# Patient Record
Sex: Female | Born: 1940
Health system: Southern US, Community
[De-identification: ages and names within clinical notes are randomized; demographics above are authoritative.]

## PROBLEM LIST (undated history)

## (undated) DIAGNOSIS — N952 Postmenopausal atrophic vaginitis: Secondary | ICD-10-CM

## (undated) DIAGNOSIS — S62609A Fracture of unspecified phalanx of unspecified finger, initial encounter for closed fracture: Secondary | ICD-10-CM

## (undated) DIAGNOSIS — D219 Benign neoplasm of connective and other soft tissue, unspecified: Secondary | ICD-10-CM

## (undated) DIAGNOSIS — I10 Essential (primary) hypertension: Secondary | ICD-10-CM

## (undated) HISTORY — DX: Essential (primary) hypertension: I10

## (undated) HISTORY — DX: Benign neoplasm of connective and other soft tissue, unspecified: D21.9

## (undated) HISTORY — PX: OTHER SURGICAL HISTORY: SHX169

## (undated) HISTORY — DX: Postmenopausal atrophic vaginitis: N95.2

## (undated) HISTORY — DX: Fracture of unspecified phalanx of unspecified finger, initial encounter for closed fracture: S62.609A

## (undated) HISTORY — PX: ABDOMINAL HYSTERECTOMY: SHX81

---

## 1997-10-16 HISTORY — PX: TOTAL ABDOMINAL HYSTERECTOMY W/ BILATERAL SALPINGOOPHORECTOMY: SHX83

## 1998-05-26 ENCOUNTER — Inpatient Hospital Stay (HOSPITAL_COMMUNITY): Admission: RE | Admit: 1998-05-26 | Discharge: 1998-05-29 | Payer: Self-pay | Admitting: Obstetrics and Gynecology

## 1999-11-21 ENCOUNTER — Other Ambulatory Visit: Admission: RE | Admit: 1999-11-21 | Discharge: 1999-11-21 | Payer: Self-pay | Admitting: Obstetrics and Gynecology

## 2000-07-05 ENCOUNTER — Ambulatory Visit (HOSPITAL_COMMUNITY): Admission: RE | Admit: 2000-07-05 | Discharge: 2000-07-05 | Payer: Self-pay | Admitting: Gastroenterology

## 2000-07-05 ENCOUNTER — Encounter (INDEPENDENT_AMBULATORY_CARE_PROVIDER_SITE_OTHER): Payer: Self-pay | Admitting: *Deleted

## 2000-12-03 ENCOUNTER — Emergency Department (HOSPITAL_COMMUNITY): Admission: EM | Admit: 2000-12-03 | Discharge: 2000-12-03 | Payer: Self-pay | Admitting: *Deleted

## 2001-04-01 ENCOUNTER — Other Ambulatory Visit: Admission: RE | Admit: 2001-04-01 | Discharge: 2001-04-01 | Payer: Self-pay | Admitting: Obstetrics and Gynecology

## 2002-04-08 ENCOUNTER — Other Ambulatory Visit: Admission: RE | Admit: 2002-04-08 | Discharge: 2002-04-08 | Payer: Self-pay | Admitting: Obstetrics and Gynecology

## 2003-04-06 ENCOUNTER — Other Ambulatory Visit: Admission: RE | Admit: 2003-04-06 | Discharge: 2003-04-06 | Payer: Self-pay | Admitting: Obstetrics and Gynecology

## 2004-05-31 ENCOUNTER — Other Ambulatory Visit: Admission: RE | Admit: 2004-05-31 | Discharge: 2004-05-31 | Payer: Self-pay | Admitting: Obstetrics and Gynecology

## 2004-06-29 ENCOUNTER — Encounter: Admission: RE | Admit: 2004-06-29 | Discharge: 2004-06-29 | Payer: Self-pay | Admitting: Orthopaedic Surgery

## 2004-07-12 ENCOUNTER — Encounter: Admission: RE | Admit: 2004-07-12 | Discharge: 2004-07-12 | Payer: Self-pay | Admitting: Orthopaedic Surgery

## 2005-07-13 ENCOUNTER — Other Ambulatory Visit: Admission: RE | Admit: 2005-07-13 | Discharge: 2005-07-13 | Payer: Self-pay | Admitting: Obstetrics and Gynecology

## 2005-12-20 ENCOUNTER — Encounter: Admission: RE | Admit: 2005-12-20 | Discharge: 2005-12-20 | Payer: Self-pay | Admitting: Orthopaedic Surgery

## 2006-02-09 ENCOUNTER — Encounter: Admission: RE | Admit: 2006-02-09 | Discharge: 2006-02-09 | Payer: Self-pay | Admitting: Orthopaedic Surgery

## 2006-12-25 ENCOUNTER — Other Ambulatory Visit: Admission: RE | Admit: 2006-12-25 | Discharge: 2006-12-25 | Payer: Self-pay | Admitting: Obstetrics and Gynecology

## 2008-01-21 ENCOUNTER — Other Ambulatory Visit: Admission: RE | Admit: 2008-01-21 | Discharge: 2008-01-21 | Payer: Self-pay | Admitting: Obstetrics and Gynecology

## 2008-11-06 ENCOUNTER — Encounter: Admission: RE | Admit: 2008-11-06 | Discharge: 2008-11-06 | Payer: Self-pay | Admitting: Gastroenterology

## 2008-12-12 ENCOUNTER — Emergency Department (HOSPITAL_COMMUNITY): Admission: EM | Admit: 2008-12-12 | Discharge: 2008-12-12 | Payer: Self-pay | Admitting: Emergency Medicine

## 2009-03-03 ENCOUNTER — Ambulatory Visit: Payer: Self-pay | Admitting: Obstetrics and Gynecology

## 2009-03-03 ENCOUNTER — Encounter: Payer: Self-pay | Admitting: Obstetrics and Gynecology

## 2009-03-03 ENCOUNTER — Other Ambulatory Visit: Admission: RE | Admit: 2009-03-03 | Discharge: 2009-03-03 | Payer: Self-pay | Admitting: Obstetrics and Gynecology

## 2010-03-09 ENCOUNTER — Ambulatory Visit: Payer: Self-pay | Admitting: Obstetrics and Gynecology

## 2010-03-09 ENCOUNTER — Other Ambulatory Visit: Admission: RE | Admit: 2010-03-09 | Discharge: 2010-03-09 | Payer: Self-pay | Admitting: Obstetrics and Gynecology

## 2010-05-10 ENCOUNTER — Ambulatory Visit: Payer: Self-pay | Admitting: Obstetrics and Gynecology

## 2010-05-23 ENCOUNTER — Ambulatory Visit: Payer: Self-pay | Admitting: Obstetrics and Gynecology

## 2010-11-05 ENCOUNTER — Encounter: Payer: Self-pay | Admitting: Orthopaedic Surgery

## 2010-11-06 ENCOUNTER — Encounter: Payer: Self-pay | Admitting: Orthopaedic Surgery

## 2011-01-30 ENCOUNTER — Other Ambulatory Visit: Payer: Self-pay | Admitting: Dermatology

## 2011-01-31 LAB — CBC
MCHC: 35.7 g/dL (ref 30.0–36.0)
Platelets: 253 10*3/uL (ref 150–400)
RBC: 4.21 MIL/uL (ref 3.87–5.11)
RDW: 12.2 % (ref 11.5–15.5)
WBC: 4.1 10*3/uL (ref 4.0–10.5)

## 2011-01-31 LAB — POCT I-STAT, CHEM 8
BUN: 27 mg/dL — ABNORMAL HIGH (ref 6–23)
Calcium, Ion: 1.12 mmol/L (ref 1.12–1.32)
Glucose, Bld: 108 mg/dL — ABNORMAL HIGH (ref 70–99)
HCT: 43 % (ref 36.0–46.0)
Hemoglobin: 14.6 g/dL (ref 12.0–15.0)

## 2011-01-31 LAB — D-DIMER, QUANTITATIVE: D-Dimer, Quant: <0.22 ug/mL-FEU (ref 0.00–0.48)

## 2011-01-31 LAB — POCT CARDIAC MARKERS
CKMB, poc: <1 ng/mL — ABNORMAL LOW (ref 1.0–8.0)
Myoglobin, poc: 46.3 ng/mL (ref 12–200)
Myoglobin, poc: 70.3 ng/mL (ref 12–200)

## 2011-01-31 LAB — DIFFERENTIAL
Basophils Absolute: 0 10*3/uL (ref 0.0–0.1)
Eosinophils Relative: 1 % (ref 0–5)
Monocytes Absolute: 0.3 10*3/uL (ref 0.1–1.0)
Monocytes Relative: 8 % (ref 3–12)
Neutro Abs: 2.1 10*3/uL (ref 1.7–7.7)

## 2011-02-28 NOTE — Consult Note (Signed)
NAME:  Chelsey Huff, Chelsey Huff                   ACCOUNT NO.:  [id number]   MEDICAL RECORD NO.:  [id number]          PATIENT TYPE:  EMS   LOCATION:  [facility]                         FACILITY:  [facility]   PHYSICIAN:  Corinna L. Lendell Caprice, MDDATE OF BIRTH:  [birth year]   DATE OF CONSULTATION:  12/12/2008  DATE OF DISCHARGE:                                 CONSULTATION   REQUESTING PHYSICIAN:  Marisa Severin, MD   REASON FOR CONSULTATION:  Chest pain.   IMPRESSION/RECOMMENDATIONS:  1. Musculoskeletal chest pain:  The patient's pain is entirely      reproducible.  She has a history of recurrent costochondritis.  I      will give her a dose of Toradol and she may follow up with her      primary care physician as needed.  I have also recommended that she      contact Dr. Katrinka Blazing as an outpatient stress test, non-urgently, may      be reasonable.  I have also recommended that she take ibuprofen 400      mg t.i.d. for 3-5 days.  2. Osteopenia.  3. Anxiety state, not otherwise specified.   HISTORY OF PRESENT ILLNESS:  Chelsey Huff is a pleasant 70 year old white  female who presents with sharp substernal chest pain.  It lasted less  than a minute and has occurred several times today.  She does not know  whether it is positional.  It was not pleuritic.  She has had no cough,  fevers, or chills.  No shortness of breath.  No nausea.   PAST MEDICAL HISTORY:  As above.   MEDICATIONS:  Calcium, Boniva, lorazepam as needed, and multivitamins.   ALLERGIES:  She is allergic to CODEINE.   SOCIAL HISTORY:  She is married.  She drinks a glass of wine or two  daily, but denies drinking too excess.  There is no history of drug  abuse.  She has experienced a lot of family stressors recently.   FAMILY HISTORY:  Reviewed and as per previous review of systems as  above, otherwise negative.   PHYSICAL EXAMINATION:  VITAL SIGNS:  Temperature is 97.8, blood pressure  158/93, pulse 84, respiratory rate 18, and oxygen saturation 100%  on  room air.  GENERAL:  The patient is an anxious-appearing white female, sometimes  tearful.  HEENT:  Normocephalic and atraumatic.  Pupils equal, round, and reactive  to light.  Sclera nonicteric.  Moist mucous membranes.  NECK:  Supple.  No carotid bruits.  No lymphadenopathy.  LUNGS:  Clear to auscultation bilaterally without wheezes, rhonchi, or  rales.  CARDIOVASCULAR:  Regular rate and rhythm without murmurs, gallops, or  rubs.  She does have reproducible chest pain to palpation of the sternal  and parasternal areas.  ABDOMEN:  Soft, nontender, and nondistended.  GU and RECTAL:  Deferred.  EXTREMITIES.  No clubbing, cyanosis, or edema.  No calf tenderness.   LABORATORY DATA:  Point-of-care enzymes negative.  CBC unremarkable.  Basic metabolic panel unremarkable.  EKG shows normal sinus rhythm with  PVCs/trigeminy.  Telemetry, the patient is currently  in normal sinus  rhythm.  Chest x-ray negative.   I will leave the message with Dr. Michaelle Copas staff.  The patient is being  discharged in stable condition.   ACTIVITY:  Ad lib.   DIET:  As tolerated.      Corinna L. Lendell Caprice, MD  Electronically Signed     CLS/MEDQ  D:  12/12/2008  T:  12/12/2008  Job:  [id number]   cc:   Lyn Records, M.D.  Tasia Catchings, M.D.

## 2011-03-03 NOTE — H&P (Signed)
[facility]  Patient:    Chelsey Huff, Chelsey Huff                  MRN: [id number] Adm. Date:  [id number] Attending:  Carmelina Peal CC:         Daphene Jaeger, M.D.                         History and Physical  EMERGENCY ROOM NOTE  HISTORY OF PRESENT ILLNESS:  Chelsey Huff is a very nice 70 year old white female who came to the emergency room with atypical chest pain.  She developed this beginning on Friday with several episodes of five to ten minutes of very sharp pain in the upper anterior chest along the sternal border on the left.  The pain was not exertional.  It was not associated with nausea, shortness of breath, diaphoresis or any other symptoms.  She never pressed on the area to see if it was tender.  It should be noted that she did have an episode of pericarditis pretty well documented in 1988, with pain at that time radiating to her left scapula and it was associated with a pericardial friction rub.  She was treated at that time with Prednisone and promptly improved.  Her workup then included a tuberculosis test, ANA and sedimentation rate all of which were normal.  She has not had any recurrence of that specific pain.  However, the current pain is somewhat similar to the type of pain she has had in the past that may have occurred as the result of a water skiing accident several years ago.  She has had a 2D echocardiogram, which is normal and no evidence of mitral valve prolapse and a negative treadmill test in the past.  Other medical problems include anxiety attacks, left ovarian and uterine fibroids, a sister with colon cancer with a negative colonoscopy and DJD.  ALLERGIES:  CODEINE.  SMOKING:  None.  ALCOHOL:  Two glasses of wine weekly.  CAFFEINE:  None.  CURRENT MEDICINES:  Enteric coated aspirin daily and Premarin 0.625 mg daily.  PREVIOUS SURGERY: Tubal ligation, dilatation and curettage and total  abdominal hysterectomy/bilateral salpingo-oophorectomy.  INJURIES:  Laceration of the right leg and fracture of the right foot.  FAMILY HISTORY:  Father died at age 74 in an automobile accident.  Mother is alive at age 24 living in [place], [place] with a pacemaker and a TIA. They have had a stormy relationship.  She has one brother who died at age 56 in an auto accident and three sisters, one with hypertension, one with colon cancer and one in good health.  She has three children, who are all alive and well.  SOCIAL HISTORY:  Native of [place].  She has lived in [place] since 1979. Married since 1969.  Magazine features editor.  PHYSICAL EXAMINATION:  VITAL SIGNS:  Blood pressure 140/90, pulse 72 and regular.  LUNGS:  Clear.  HEART:  Tones are absolutely normal and there is no friction rub.  I can reproduce her pain by pressure in the sternal chondral junction along the left sternal border.  ABDOMEN:  Obese but nontender without organomegaly or masses palpable.  EXTREMITIES:  Showed no clubbing or edema.  EKG is sinus rhythm, within normal limits.  IMPRESSION:  This appears to be chest wall pain, probably costochondritis.  PLAN:  The patient is reassured, given a prescription for Darvocet and heat. She is to call  tomorrow and I am going to go ahead and arrange an exercise test as an outpatient. DD:  12/03/00 TD:  12/04/00 Job: [id number]

## 2011-04-05 ENCOUNTER — Encounter: Payer: Self-pay | Admitting: Obstetrics and Gynecology

## 2011-04-13 ENCOUNTER — Other Ambulatory Visit: Payer: Self-pay | Admitting: Obstetrics and Gynecology

## 2011-04-13 ENCOUNTER — Other Ambulatory Visit (HOSPITAL_COMMUNITY)
Admission: RE | Admit: 2011-04-13 | Discharge: 2011-04-13 | Disposition: A | Discharge status: Home / Self Care | Payer: BC Managed Care – PPO | Source: Ambulatory Visit | Attending: Obstetrics and Gynecology | Admitting: Obstetrics and Gynecology

## 2011-04-13 ENCOUNTER — Encounter (INDEPENDENT_AMBULATORY_CARE_PROVIDER_SITE_OTHER): Payer: BC Managed Care – PPO | Admitting: Obstetrics and Gynecology

## 2011-04-13 DIAGNOSIS — Z01419 Encounter for gynecological examination (general) (routine) without abnormal findings: Secondary | ICD-10-CM

## 2011-04-13 DIAGNOSIS — Z124 Encounter for screening for malignant neoplasm of cervix: Secondary | ICD-10-CM | POA: Insufficient documentation

## 2011-04-26 ENCOUNTER — Encounter: Payer: Self-pay | Admitting: Obstetrics and Gynecology

## 2011-08-08 ENCOUNTER — Ambulatory Visit (INDEPENDENT_AMBULATORY_CARE_PROVIDER_SITE_OTHER): Payer: BC Managed Care – PPO | Admitting: Obstetrics and Gynecology

## 2011-08-08 DIAGNOSIS — E559 Vitamin D deficiency, unspecified: Secondary | ICD-10-CM

## 2011-08-09 LAB — VITAMIN D 25 HYDROXY (VIT D DEFICIENCY, FRACTURES): Vit D, 25-Hydroxy: 45 ng/mL (ref 30–89)

## 2012-03-29 ENCOUNTER — Other Ambulatory Visit: Payer: Self-pay | Admitting: *Deleted

## 2012-03-29 DIAGNOSIS — M8580 Other specified disorders of bone density and structure, unspecified site: Secondary | ICD-10-CM

## 2012-04-04 ENCOUNTER — Other Ambulatory Visit: Payer: Self-pay | Admitting: Obstetrics and Gynecology

## 2012-04-04 DIAGNOSIS — M8580 Other specified disorders of bone density and structure, unspecified site: Secondary | ICD-10-CM

## 2012-04-09 ENCOUNTER — Encounter: Payer: Self-pay | Admitting: Obstetrics and Gynecology

## 2012-04-24 ENCOUNTER — Encounter: Payer: Self-pay | Admitting: Obstetrics and Gynecology

## 2012-04-24 ENCOUNTER — Ambulatory Visit (INDEPENDENT_AMBULATORY_CARE_PROVIDER_SITE_OTHER): Payer: BC Managed Care – PPO | Admitting: Obstetrics and Gynecology

## 2012-04-24 VITALS — BP 130/78 | Ht 64.0 in | Wt 164.0 lb

## 2012-04-24 DIAGNOSIS — N952 Postmenopausal atrophic vaginitis: Secondary | ICD-10-CM | POA: Insufficient documentation

## 2012-04-24 DIAGNOSIS — D219 Benign neoplasm of connective and other soft tissue, unspecified: Secondary | ICD-10-CM | POA: Insufficient documentation

## 2012-04-24 DIAGNOSIS — Z01419 Encounter for gynecological examination (general) (routine) without abnormal findings: Secondary | ICD-10-CM

## 2012-04-24 DIAGNOSIS — M810 Age-related osteoporosis without current pathological fracture: Secondary | ICD-10-CM | POA: Insufficient documentation

## 2012-04-24 DIAGNOSIS — I10 Essential (primary) hypertension: Secondary | ICD-10-CM | POA: Insufficient documentation

## 2012-04-24 MED ORDER — ESTRADIOL 0.1 MG/GM VA CREA: 1.0000 g | TOPICAL_CREAM | Freq: Every day | VAGINAL | Status: DC

## 2012-04-24 NOTE — Progress Notes (Signed)
The patient came to see me today for her annual GYN exam. She had been diagnosed by me with osteoporosis and had taken 4 years of Boniva after previously taken Evista. The patient has now been on drug holiday for 2 years. Her bone density has gotten worse. She has lost over 5% bone in her left hip. She is not osteoporotic but has a T score of -2.3. Her fracture risk scores are elevated although obviously they are not used for treatment since she already  was treated previously. She takes calcium and extra vitamin D. Both calcium and vitamin D levels were checked last month by her PCP. She has not had any fractures. She is a nonsmoker nondrinker. She exercises regularly. 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 Her mother and grandmother had osteoporosis. She uses estrogen cream for vaginal dryness with excellent results. She is doing well without systemic HRT. She is having no vaginal bleeding. She is having no pelvic pain. She has had a hysterectomy and always had normal Pap smears. Her last Pap smear was 2012.she just had a normal mammogram.  HEENT: Within normal limits. Kennon Portela present. Neck: No masses. Supraclavicular lymph nodes: Not enlarged. Breasts: Examined in both sitting and lying position. Symmetrical without skin changes or masses. Abdomen: Soft no masses guarding or rebound. No hernias. Pelvic: External within normal limits. BUS within normal limits. Vaginal examination shows good estrogen effect, no cystocele enterocele or rectocele. Cervix and uterus absent. Adnexa within normal limits. Rectovaginal confirmatory. Extremities within normal limits.  Assessment: #1. Osteoporosis #2. Atrophic vaginitis  Plan: We had  a long discussion about reinitiating treatment based on worsening T. Scores, family history, etc. I told her I favor Prolia. She was given information to read. We will get her approved. She will continue yearly mammograms. Lab through PCP-she will  get as the results. She is very positive about taking medication.The new Pap smear guidelines were discussed with the patient. No pap done.

## 2012-05-15 ENCOUNTER — Telehealth: Payer: Self-pay | Admitting: *Deleted

## 2012-05-15 NOTE — Telephone Encounter (Signed)
Patient informed Prolia covered with no copay after doing prior auth.  Will order Prolia and call patient to set up ov for injection when it comes in.

## 2012-05-16 ENCOUNTER — Other Ambulatory Visit: Payer: Self-pay | Admitting: *Deleted

## 2012-05-16 DIAGNOSIS — M810 Age-related osteoporosis without current pathological fracture: Secondary | ICD-10-CM

## 2012-05-16 MED ORDER — DENOSUMAB 60 MG/ML ~~LOC~~ SOLN: 60.0000 mg | Freq: Once | SUBCUTANEOUS | Status: AC

## 2012-05-16 NOTE — Telephone Encounter (Signed)
Patient informed.  appt set up for injection

## 2012-05-16 NOTE — Telephone Encounter (Signed)
Patient informed Prolia injection is here in the office.  She wanted to check with you if you needed to speak with her further about anything to do with bone density, if not she will proceed with injection.

## 2012-05-16 NOTE — Telephone Encounter (Signed)
She should proceed with injection

## 2012-05-17 ENCOUNTER — Ambulatory Visit: Payer: BC Managed Care – PPO

## 2012-07-12 ENCOUNTER — Other Ambulatory Visit: Payer: Self-pay | Admitting: Internal Medicine

## 2012-07-12 DIAGNOSIS — R748 Abnormal levels of other serum enzymes: Secondary | ICD-10-CM

## 2012-07-17 ENCOUNTER — Ambulatory Visit
Admission: RE | Admit: 2012-07-17 | Discharge: 2012-07-17 | Disposition: A | Discharge status: Home / Self Care | Payer: BC Managed Care – PPO | Source: Ambulatory Visit | Attending: Internal Medicine | Admitting: Internal Medicine

## 2012-07-17 DIAGNOSIS — R748 Abnormal levels of other serum enzymes: Secondary | ICD-10-CM

## 2012-07-17 MED ORDER — IOHEXOL 300 MG/ML  SOLN
100.0000 mL | Freq: Once | INTRAMUSCULAR | Status: AC | PRN
  Administered 2012-07-17: 100 mL via INTRAVENOUS

## 2013-04-22 ENCOUNTER — Other Ambulatory Visit: Payer: Self-pay | Admitting: Radiology

## 2013-10-26 ENCOUNTER — Emergency Department (HOSPITAL_COMMUNITY): Payer: BC Managed Care – PPO

## 2013-10-26 ENCOUNTER — Emergency Department (HOSPITAL_COMMUNITY)
Admission: EM | Admit: 2013-10-26 | Discharge: 2013-10-26 | Disposition: A | Discharge status: Home / Self Care | Payer: BC Managed Care – PPO | Attending: Emergency Medicine | Admitting: Emergency Medicine

## 2013-10-26 ENCOUNTER — Encounter (HOSPITAL_COMMUNITY): Payer: Self-pay | Admitting: Emergency Medicine

## 2013-10-26 DIAGNOSIS — R071 Chest pain on breathing: Secondary | ICD-10-CM | POA: Insufficient documentation

## 2013-10-26 DIAGNOSIS — R079 Chest pain, unspecified: Secondary | ICD-10-CM

## 2013-10-26 DIAGNOSIS — Z8742 Personal history of other diseases of the female genital tract: Secondary | ICD-10-CM | POA: Insufficient documentation

## 2013-10-26 DIAGNOSIS — I10 Essential (primary) hypertension: Secondary | ICD-10-CM | POA: Insufficient documentation

## 2013-10-26 DIAGNOSIS — M810 Age-related osteoporosis without current pathological fracture: Secondary | ICD-10-CM | POA: Insufficient documentation

## 2013-10-26 DIAGNOSIS — Z79899 Other long term (current) drug therapy: Secondary | ICD-10-CM | POA: Insufficient documentation

## 2013-10-26 LAB — CBC WITH DIFFERENTIAL/PLATELET
Basophils Absolute: 0 10*3/uL (ref 0.0–0.1)
Basophils Relative: 1 % (ref 0–1)
EOS ABS: 0.1 10*3/uL (ref 0.0–0.7)
Eosinophils Relative: 2 % (ref 0–5)
HEMATOCRIT: 38.1 % (ref 36.0–46.0)
Hemoglobin: 13 g/dL (ref 12.0–15.0)
LYMPHS ABS: 1.9 10*3/uL (ref 0.7–4.0)
LYMPHS PCT: 32 % (ref 12–46)
MCH: 33.2 pg (ref 26.0–34.0)
MCHC: 34.1 g/dL (ref 30.0–36.0)
MCV: 97.4 fL (ref 78.0–100.0)
MONO ABS: 0.5 10*3/uL (ref 0.1–1.0)
MONOS PCT: 8 % (ref 3–12)
Neutro Abs: 3.5 10*3/uL (ref 1.7–7.7)
Neutrophils Relative %: 58 % (ref 43–77)
Platelets: 207 10*3/uL (ref 150–400)
RBC: 3.91 MIL/uL (ref 3.87–5.11)
RDW: 12.4 % (ref 11.5–15.5)
WBC: 6 10*3/uL (ref 4.0–10.5)

## 2013-10-26 LAB — POCT I-STAT TROPONIN I
Troponin i, poc: 0.01 ng/mL (ref 0.00–0.08)
Troponin i, poc: 0 ng/mL (ref 0.00–0.08)

## 2013-10-26 LAB — D-DIMER, QUANTITATIVE: D-Dimer, Quant: 0.28 ug/mL-FEU (ref 0.00–0.48)

## 2013-10-26 LAB — BASIC METABOLIC PANEL
BUN: 32 mg/dL — AB (ref 6–23)
CALCIUM: 9.5 mg/dL (ref 8.4–10.5)
CO2: 25 meq/L (ref 19–32)
CREATININE: 0.78 mg/dL (ref 0.50–1.10)
Chloride: 98 mEq/L (ref 96–112)
GFR calc Af Amer: >90 mL/min (ref >90)
GFR calc non Af Amer: 83 mL/min — ABNORMAL LOW (ref >90)
GLUCOSE: 105 mg/dL — AB (ref 70–99)
Potassium: 3.9 mEq/L (ref 3.7–5.3)
Sodium: 138 mEq/L (ref 137–147)

## 2013-10-26 NOTE — ED Notes (Signed)
Onset today chest pain 2/10 across chest and called EMS. Patient took own aspirin 325mg  PO. Pain currently 0/10.

## 2013-10-26 NOTE — Discharge Instructions (Signed)
Chest Pain (Nonspecific) °It is often hard to give a specific diagnosis for the cause of chest pain. There is always a chance that your pain could be related to something serious, such as a heart attack or a blood clot in the lungs. You need to follow up with your caregiver for further evaluation. °CAUSES  °· Heartburn. °· Pneumonia or bronchitis. °· Anxiety or stress. °· Inflammation around your heart (pericarditis) or lung (pleuritis or pleurisy). °· A blood clot in the lung. °· A collapsed lung (pneumothorax). It can develop suddenly on its own (spontaneous pneumothorax) or from injury (trauma) to the chest. °· Shingles infection (herpes zoster virus). °The chest wall is composed of bones, muscles, and cartilage. Any of these can be the source of the pain. °· The bones can be bruised by injury. °· The muscles or cartilage can be strained by coughing or overwork. °· The cartilage can be affected by inflammation and become sore (costochondritis). °DIAGNOSIS  °Lab tests or other studies, such as X-rays, electrocardiography, stress testing, or cardiac imaging, may be needed to find the cause of your pain.  °TREATMENT  °· Treatment depends on what may be causing your chest pain. Treatment may include: °· Acid blockers for heartburn. °· Anti-inflammatory medicine. °· Pain medicine for inflammatory conditions. °· Antibiotics if an infection is present. °· You may be advised to change lifestyle habits. This includes stopping smoking and avoiding alcohol, caffeine, and chocolate. °· You may be advised to keep your head raised (elevated) when sleeping. This reduces the chance of acid going backward from your stomach into your esophagus. °· Most of the time, nonspecific chest pain will improve within 2 to 3 days with rest and mild pain medicine. °HOME CARE INSTRUCTIONS  °· If antibiotics were prescribed, take your antibiotics as directed. Finish them even if you start to feel better. °· For the next few days, avoid physical  activities that bring on chest pain. Continue physical activities as directed. °· Do not smoke. °· Avoid drinking alcohol. °· Only take over-the-counter or prescription medicine for pain, discomfort, or fever as directed by your caregiver. °· Follow your caregiver's suggestions for further testing if your chest pain does not go away. °· Keep any follow-up appointments you made. If you do not go to an appointment, you could develop lasting (chronic) problems with pain. If there is any problem keeping an appointment, you must call to reschedule. °SEEK MEDICAL CARE IF:  °· You think you are having problems from the medicine you are taking. Read your medicine instructions carefully. °· Your chest pain does not go away, even after treatment. °· You develop a rash with blisters on your chest. °SEEK IMMEDIATE MEDICAL CARE IF:  °· You have increased chest pain or pain that spreads to your arm, neck, jaw, back, or abdomen. °· You develop shortness of breath, an increasing cough, or you are coughing up blood. °· You have severe back or abdominal pain, feel nauseous, or vomit. °· You develop severe weakness, fainting, or chills. °· You have a fever. °THIS IS AN EMERGENCY. Do not wait to see if the pain will go away. Get medical help at once. Call your local emergency services (911 in [place] Do not drive yourself to the hospital. °MAKE SURE YOU:  °· Understand these instructions. °· Will watch your condition. °· Will get help right away if you are not doing well or get worse. °Document Released: 07/12/2005 Document Revised: 12/25/2011 Document Reviewed: 05/07/2008 °ExitCare® Patient Information ©2014 [employer]   LL[employer]. ° °

## 2013-10-26 NOTE — ED Notes (Signed)
EKG completed and given to EDP at [facility]

## 2013-10-26 NOTE — ED Provider Notes (Signed)
Medical screening examination/treatment/procedure(s) were conducted as a shared visit with non-physician practitioner(s) and myself.  I personally evaluated the patient during the encounter.  EKG Interpretation    Date/Time:  Sunday October 26 2013 18:20:30 EST Ventricular Rate:  82 PR Interval:  188 QRS Duration: 99 QT Interval:  378 QTC Calculation: 441 R Axis:   -31 Text Interpretation:  Sinus rhythm Low voltage, precordial leads Left ventricular hypertrophy Confirmed by Zenia Resides  MD, Arthur Aydelotte ([id number]) on 10/26/2013 6:23:56 PM           Patient seen and examined. Had a four-minute episode of chest pain characterized as sharp and nonradiating. No associated symptoms. She is currently pain-free at this time. No recent travel history or leg pain. Patient will have a doctor troponin here and her EKG is nonacute.  If Her troponins are negative and d-dimer is negative she will be sent to cardiology followup  Leota Jacobsen, MD 10/26/13 2117

## 2013-10-26 NOTE — ED Provider Notes (Signed)
CSN: [id number]     Arrival date & time 10/26/13  1807 History   First MD Initiated Contact with Patient 10/26/13 1818     Chief Complaint  Patient presents with  . Chest Pain   (Consider location/radiation/quality/duration/timing/severity/associated sxs/prior Treatment) HPI Comments: Patient presents emergency department with chief complaint of chest pain. She states the chest pain started this evening. She states it came on suddenly, and she describes it as a sharp pressure to the side of her chest. It did not radiate. She denies any shortness of breath or diaphoresis. She states the pain has since resolved. She has a history of costochondritis, but states that this felt much different. Cardiac risk factors include hypertension and hyperlipidemia. She states that she took an aspirin at home. Currently, she states the pain is 0/10. She has never had anything like this before.  The history is provided by the patient. No language interpreter was used.    Past Medical History  Diagnosis Date  . Atrophic vaginitis   . Fibroids   . Osteoporosis   . Hypertension    Past Surgical History  Procedure Laterality Date  . Total abdominal hysterectomy w/ bilateral salpingoophorectomy  1999  . Abdominal hysterectomy     Family History  Problem Relation Age of Onset  . Colon cancer Sister   . Melanoma Son    History  Substance Use Topics  . Smoking status: Never Smoker   . Smokeless tobacco: Not on file  . Alcohol Use: 3.5 oz/week    7 drink(s) per week   OB History   Grav Para Term Preterm Abortions TAB SAB Ect Mult Living   3 3 3       3      Review of Systems  All other systems reviewed and are negative.    Allergies  Codeine and Doxycycline  Home Medications   Current Outpatient Rx  Name  Route  Sig  Dispense  Refill  . Cholecalciferol (VITAMIN D PO)   Oral   Take 3,200 Units by mouth daily.          . multivitamin (THERAGRAN) per tablet   Oral   Take 1 tablet by  mouth daily.            BP 159/60  Pulse 78  Temp(Src) 98.2 F (36.8 C) (Oral)  Resp 16  Ht 5\' 4"  (1.626 m)  Wt 162 lb (73.483 kg)  BMI 27.79 kg/m2  SpO2 100% Physical Exam  Nursing note and vitals reviewed. Constitutional: She is oriented to person, place, and time. She appears well-developed and well-nourished.  HENT:  Head: Normocephalic and atraumatic.  Eyes: Conjunctivae and EOM are normal. Pupils are equal, round, and reactive to light.  Neck: Normal range of motion. Neck supple.  Cardiovascular: Normal rate and regular rhythm.  Exam reveals no gallop and no friction rub.   No murmur heard. Pulmonary/Chest: Effort normal and breath sounds normal. No respiratory distress. She has no wheezes. She has no rales. She exhibits no tenderness.  Mild chest wall tenderness to palpation, this is normal for the patient  Abdominal: Soft. Bowel sounds are normal. She exhibits no distension and no mass. There is no tenderness. There is no rebound and no guarding.  Musculoskeletal: Normal range of motion. She exhibits no edema and no tenderness.  Neurological: She is alert and oriented to person, place, and time.  Skin: Skin is warm and dry.  Psychiatric: She has a normal mood and affect. Her behavior is  normal. Judgment and thought content normal.    ED Course  Procedures (including critical care time) Results for orders placed during the hospital encounter of 10/26/13  CBC WITH DIFFERENTIAL      Result Value Range   WBC 6.0  4.0 - 10.5 K/uL   RBC 3.91  3.87 - 5.11 MIL/uL   Hemoglobin 13.0  12.0 - 15.0 g/dL   HCT 38.1  36.0 - 46.0 %   MCV 97.4  78.0 - 100.0 fL   MCH 33.2  26.0 - 34.0 pg   MCHC 34.1  30.0 - 36.0 g/dL   RDW 12.4  11.5 - 15.5 %   Platelets 207  150 - 400 K/uL   Neutrophils Relative % 58  43 - 77 %   Neutro Abs 3.5  1.7 - 7.7 K/uL   Lymphocytes Relative 32  12 - 46 %   Lymphs Abs 1.9  0.7 - 4.0 K/uL   Monocytes Relative 8  3 - 12 %   Monocytes Absolute 0.5  0.1  - 1.0 K/uL   Eosinophils Relative 2  0 - 5 %   Eosinophils Absolute 0.1  0.0 - 0.7 K/uL   Basophils Relative 1  0 - 1 %   Basophils Absolute 0.0  0.0 - 0.1 K/uL  BASIC METABOLIC PANEL      Result Value Range   Sodium 138  137 - 147 mEq/L   Potassium 3.9  3.7 - 5.3 mEq/L   Chloride 98  96 - 112 mEq/L   CO2 25  19 - 32 mEq/L   Glucose, Bld 105 (*) 70 - 99 mg/dL   BUN 32 (*) 6 - 23 mg/dL   Creatinine, Ser 0.78  0.50 - 1.10 mg/dL   Calcium 9.5  8.4 - 10.5 mg/dL   GFR calc non Af Amer 83 (*) >90 mL/min   GFR calc Af Amer >90  >90 mL/min  D-DIMER, QUANTITATIVE      Result Value Range   D-Dimer, Quant 0.28  0.00 - 0.48 ug/mL-FEU  POCT I-STAT TROPONIN I      Result Value Range   Troponin i, poc 0.01  0.00 - 0.08 ng/mL   Comment 3           POCT I-STAT TROPONIN I      Result Value Range   Troponin i, poc 0.00  0.00 - 0.08 ng/mL   Comment 3            Dg Chest Portable 1 View  10/26/2013   CLINICAL DATA:  Chest pain.  EXAM: PORTABLE CHEST - 1 VIEW  COMPARISON:  12/12/2008.  FINDINGS: The heart is borderline enlarged. The mediastinal and hilar contours are within normal limits and unchanged. The lungs are clear. No pleural effusion. The bony thorax is intact.  IMPRESSION: No acute cardiopulmonary findings.   Electronically Signed   By: Kalman Jewels M.D.   On: 10/26/2013 20:15      EKG Interpretation    Date/Time:  Sunday October 26 2013 18:20:30 EST Ventricular Rate:  82 PR Interval:  188 QRS Duration: 99 QT Interval:  378 QTC Calculation: 441 R Axis:   -31 Text Interpretation:  Sinus rhythm Low voltage, precordial leads Left ventricular hypertrophy Confirmed by ALLEN  MD, ANTHONY ([id number]) on 10/26/2013 6:23:56 PM            MDM   1. Chest pain     Patient with an episode of chest pain that started this evening, the  pain since resolved. She has a history of costochondritis, but states that this feels much different. Currently her pain is 0/10. States that she did not  get diaphoretic, or have any shortness of breath. No personal history of heart disease. Will check basic labs, EKG, chest x-ray. Patient has taken aspirin.  11:12 PM Patient seen by and discussed with Dr. Zenia Resides.  Will check delta troponin and d-dimer per Dr. Ayesha Rumpf request.  D-dimer and troponin are negative.  Will discharge to home with PCP and cardiology follow-up.  Dr. Zenia Resides agrees with the plan.  She is stable and ready for discharge.    Montine Circle, PA-C 10/26/13 2320

## 2013-10-29 NOTE — ED Provider Notes (Signed)
Medical screening examination/treatment/procedure(s) were conducted as a shared visit with non-physician practitioner(s) and myself.  I personally evaluated the patient during the encounter.  EKG Interpretation    Date/Time:  Sunday October 26 2013 18:20:30 EST Ventricular Rate:  82 PR Interval:  188 QRS Duration: 99 QT Interval:  378 QTC Calculation: 441 R Axis:   -31 Text Interpretation:  Sinus rhythm Low voltage, precordial leads Left ventricular hypertrophy Confirmed by Zenia Resides  MD, Nelta Caudill ([id number]) on 10/26/2013 6:23:56 PM             Leota Jacobsen, MD 10/29/13 [phone]

## 2013-11-04 ENCOUNTER — Other Ambulatory Visit: Payer: Self-pay | Admitting: Physician Assistant

## 2014-08-11 IMAGING — CT CT ABD-PEL WO/W CM
2 of 8 series · 13 of 36 positions shown, 19 images · IV contrast (READICAT/WATER & [ID] OMNI 300)
Comparison: None.

CLINICAL DATA: Elevated lipase.  Evaluate for pancreatic
abnormality.  Upper abdominal pain.

CT ABDOMEN AND PELVIS WITHOUT AND WITH CONTRAST
TECHNIQUE: Multidetector CT imaging of the abdomen and pelvis was
performed without contrast material in one or both body regions,
followed by contrast material(s) and further sections in one or
both body regions.
Contrast: 100mL OMNIPAQUE IOHEXOL 300 MG/ML  SOLN

[Series 4: arterial/port venous (id) · axial · arterial · 0.70mm/px · z∈[-310,-15]mm · 9 of 272 slices shown, 15 images]
[im 28/272  soft-tissue]
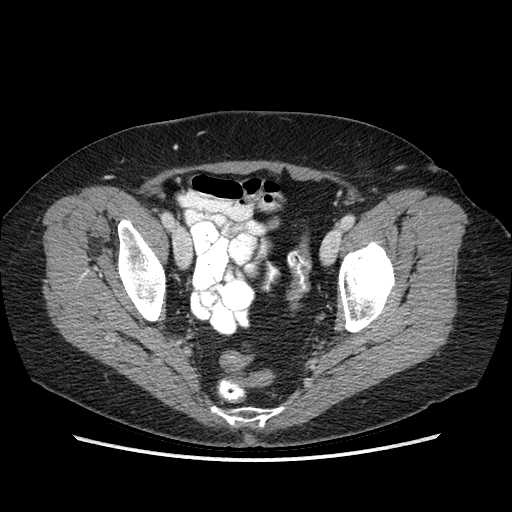
[im 28/272  bone]
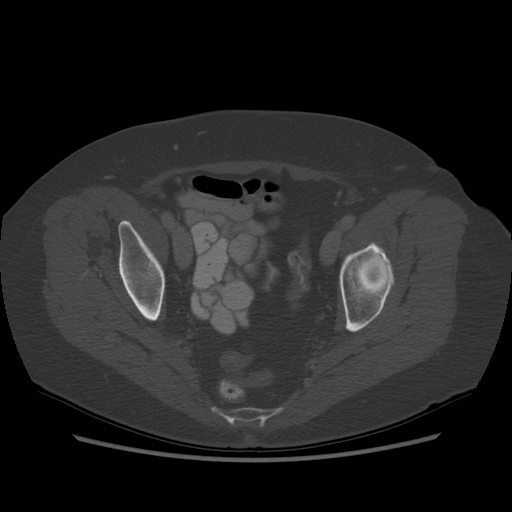
[im 55/272  soft-tissue]
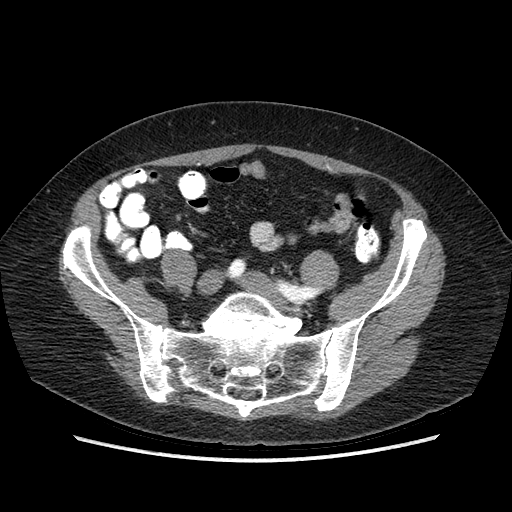
[im 82/272  soft-tissue]
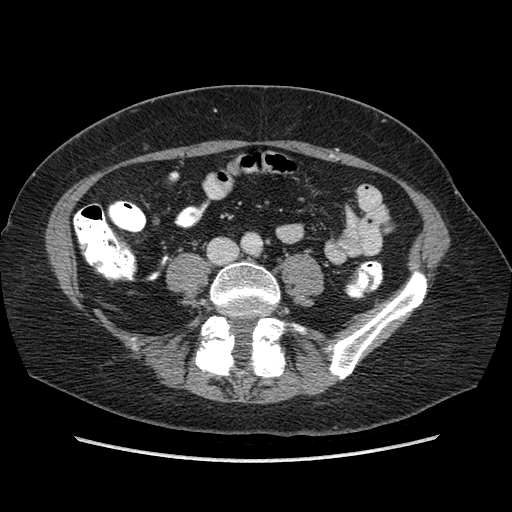
[im 109/272  soft-tissue]
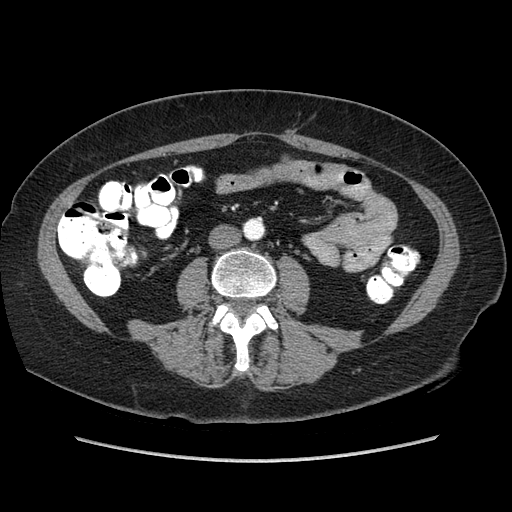
[im 136/272  soft-tissue]
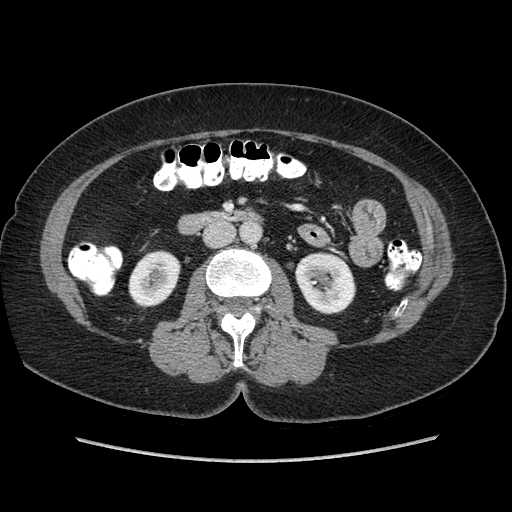
[im 163/272  soft-tissue]
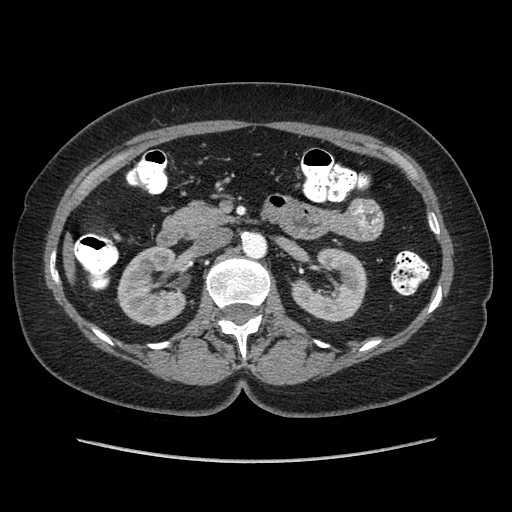
[im 163/272  lung]
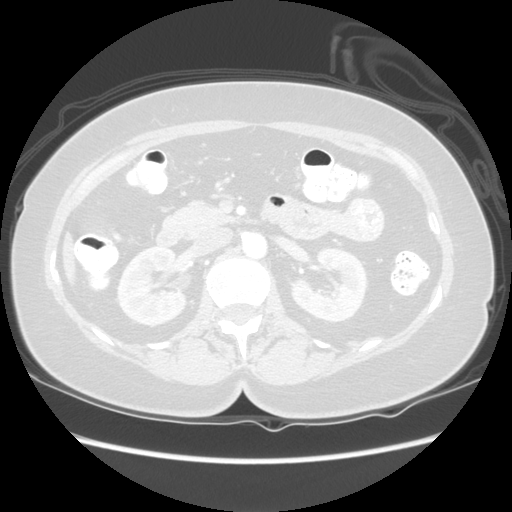
[im 190/272  soft-tissue]
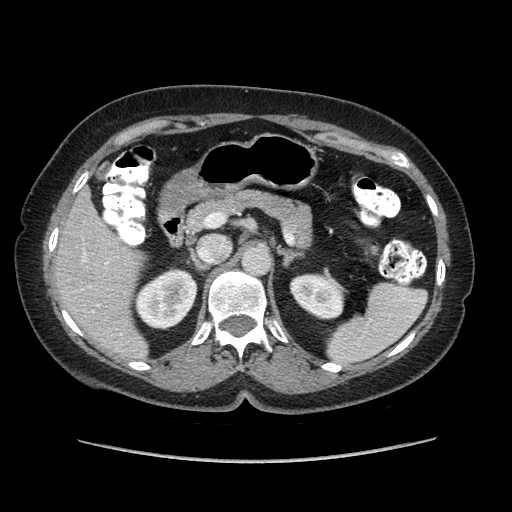
[im 190/272  lung]
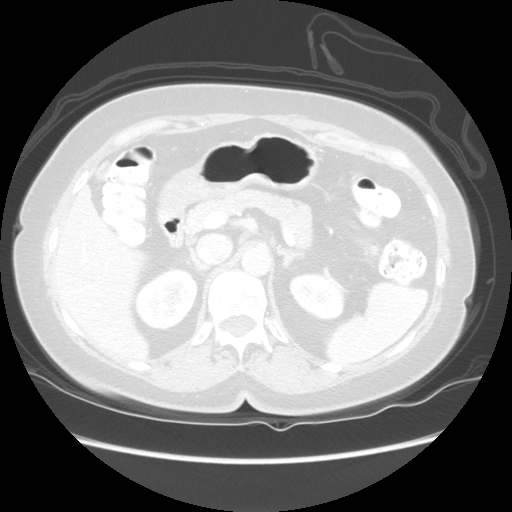
[im 217/272  soft-tissue]
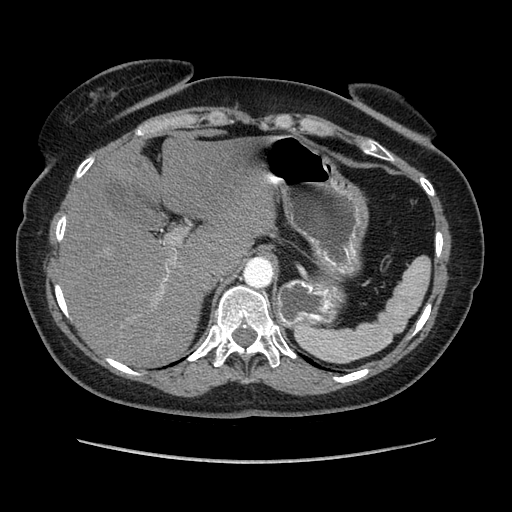
[im 217/272  lung]
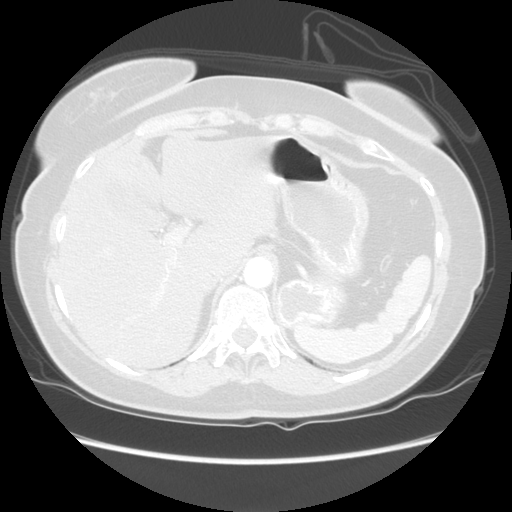
[im 244/272  soft-tissue]
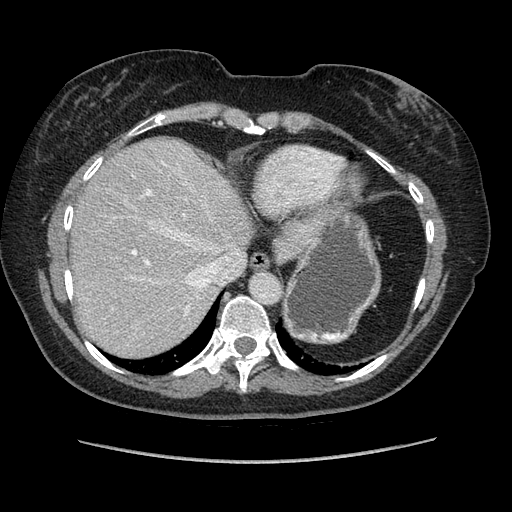
[im 244/272  lung]
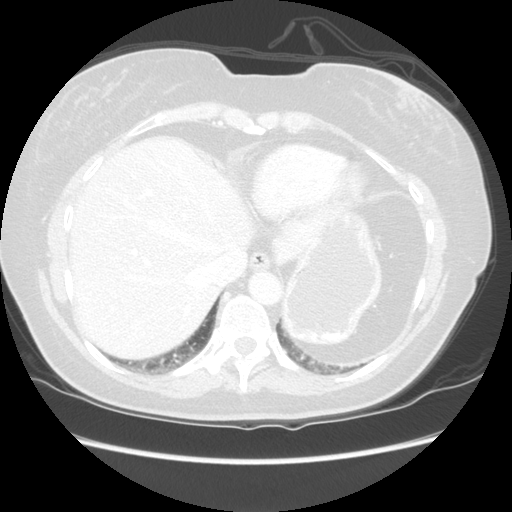
[im 244/272  bone]
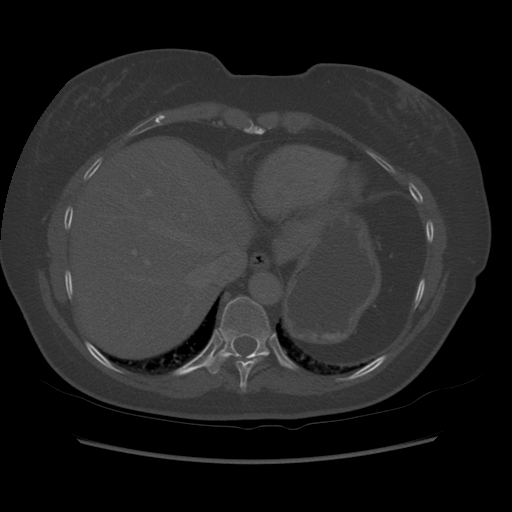

[Series 602: sagittal body · sagittal · 0.70mm/px · 4 of 145 slices shown]
[im 25/145  soft-tissue]
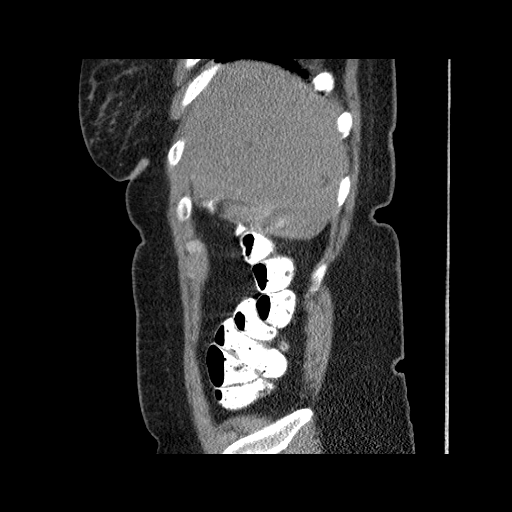
[im 49/145  soft-tissue]
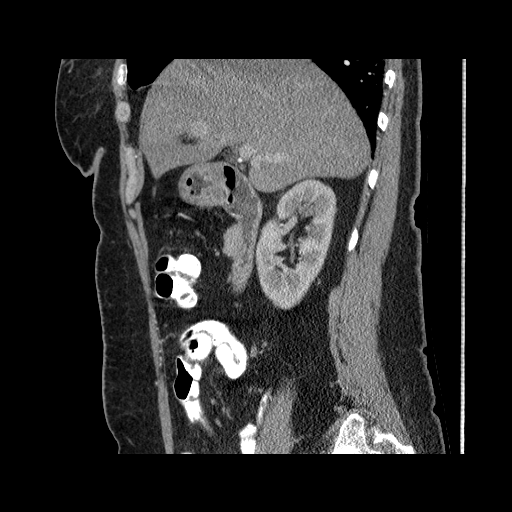
[im 73/145  soft-tissue]
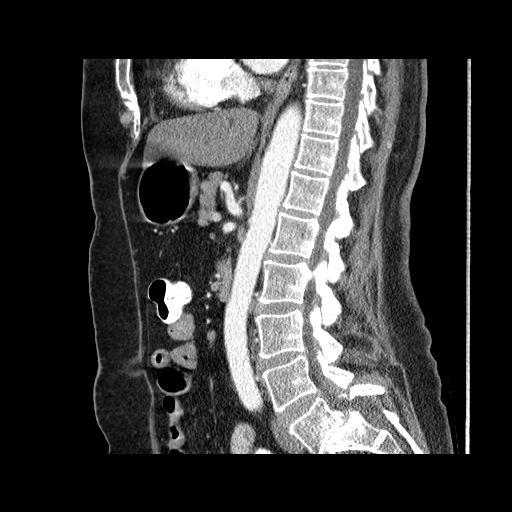
[im 97/145  soft-tissue]
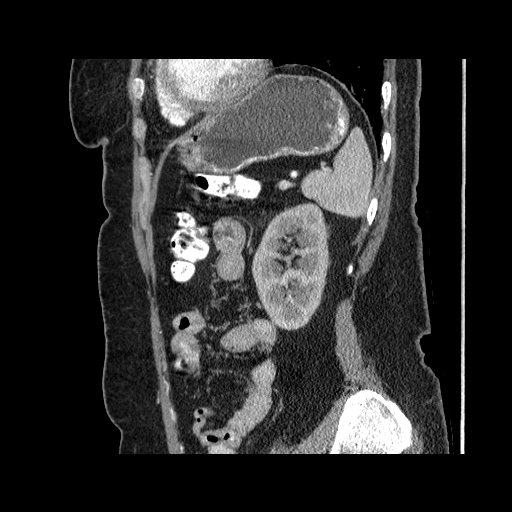

[13 of 36 positions shown; findings below may reference images not displayed]

FINDINGS: Lung bases show no acute findings.  Heart is at the upper
limits of normal in size.  No pericardial or pleural effusion.

There are scattered low attenuation lesions in the liver, measuring
up to 11 mm, which are likely cysts.  Liver, gallbladder, adrenal
glands and right kidney are otherwise unremarkable.  A sub
centimeter low attenuation lesion in the interpolar left kidney is
too small to characterize.  Statistically, however, a cyst is
likely.  No renal stones.  Kidneys, spleen, pancreas, stomach and
bowel are otherwise unremarkable.

No pathologically enlarged lymph nodes.  No free fluid.  Scattered
atherosclerotic calcification of the arterial vasculature without
abdominal aortic aneurysm. Bilateral L5 pars defects with grade II
anterolisthesis of L5 on S1, and degenerative disc disease
secondarily.  No worrisome lytic or sclerotic lesions.
IMPRESSION: No acute findings.  No evidence of acute pancreatitis.

## 2014-08-17 ENCOUNTER — Encounter (HOSPITAL_COMMUNITY): Payer: Self-pay | Admitting: Emergency Medicine

## 2015-02-18 ENCOUNTER — Other Ambulatory Visit (HOSPITAL_COMMUNITY): Payer: Self-pay | Admitting: Dermatology

## 2015-10-15 ENCOUNTER — Other Ambulatory Visit: Payer: Self-pay | Admitting: Gastroenterology

## 2015-10-20 ENCOUNTER — Encounter (HOSPITAL_COMMUNITY): Payer: Self-pay | Admitting: *Deleted

## 2015-10-20 ENCOUNTER — Other Ambulatory Visit: Payer: Self-pay | Admitting: Gastroenterology

## 2015-10-26 ENCOUNTER — Ambulatory Visit (HOSPITAL_COMMUNITY)
Admission: RE | Admit: 2015-10-26 | Discharge: 2015-10-26 | Disposition: A | Discharge status: Home / Self Care | Payer: BLUE CROSS/BLUE SHIELD | Source: Ambulatory Visit | Attending: Gastroenterology | Admitting: Gastroenterology

## 2015-10-26 ENCOUNTER — Encounter (HOSPITAL_COMMUNITY)
Admission: RE | Disposition: A | Discharge status: Home / Self Care | Payer: Self-pay | Source: Ambulatory Visit | Attending: Gastroenterology

## 2015-10-26 ENCOUNTER — Ambulatory Visit (HOSPITAL_COMMUNITY): Payer: BLUE CROSS/BLUE SHIELD | Admitting: Registered Nurse

## 2015-10-26 ENCOUNTER — Encounter (HOSPITAL_COMMUNITY): Payer: Self-pay | Admitting: *Deleted

## 2015-10-26 DIAGNOSIS — M1990 Unspecified osteoarthritis, unspecified site: Secondary | ICD-10-CM | POA: Diagnosis not present

## 2015-10-26 DIAGNOSIS — Z800 Family history of malignant neoplasm of digestive organs: Secondary | ICD-10-CM | POA: Insufficient documentation

## 2015-10-26 DIAGNOSIS — I10 Essential (primary) hypertension: Secondary | ICD-10-CM | POA: Diagnosis not present

## 2015-10-26 DIAGNOSIS — Z1211 Encounter for screening for malignant neoplasm of colon: Secondary | ICD-10-CM | POA: Diagnosis present

## 2015-10-26 HISTORY — PX: COLONOSCOPY WITH PROPOFOL: SHX5780

## 2015-10-26 SURGERY — COLONOSCOPY WITH PROPOFOL
Anesthesia: Monitor Anesthesia Care

## 2015-10-26 MED ORDER — SODIUM CHLORIDE 0.9 % IV SOLN: INTRAVENOUS | Status: DC

## 2015-10-26 MED ORDER — PROPOFOL 10 MG/ML IV BOLUS
INTRAVENOUS | Status: AC
  Filled 2015-10-26: qty 40

## 2015-10-26 MED ORDER — LIDOCAINE HCL (CARDIAC) 20 MG/ML IV SOLN
INTRAVENOUS | Status: DC | PRN
  Administered 2015-10-26: 100 mg via INTRAVENOUS

## 2015-10-26 MED ORDER — PROPOFOL 500 MG/50ML IV EMUL
INTRAVENOUS | Status: DC | PRN
  Administered 2015-10-26: 130 ug/kg/min via INTRAVENOUS

## 2015-10-26 MED ORDER — PROPOFOL 10 MG/ML IV BOLUS
INTRAVENOUS | Status: DC | PRN
  Administered 2015-10-26 (×2): 20 mg via INTRAVENOUS

## 2015-10-26 MED ORDER — LIDOCAINE HCL (CARDIAC) 20 MG/ML IV SOLN
INTRAVENOUS | Status: AC
  Filled 2015-10-26: qty 5

## 2015-10-26 SURGICAL SUPPLY — 21 items

## 2015-10-26 NOTE — H&P (Signed)
  Procedure: Screening colonoscopy. Sister diagnosed with colon cancer under age 73. Normal screening colonoscopies performed in March 2006 and March 2011  History: The patient is a 75 year old female born 07-16-1941. She is scheduled to undergo a repeat screening colonoscopy today.  Past medical history: Tubal ligation. D&C. Total abdominal hysterectomy. Bilateral salpingo-oophorectomy. Acute pericarditis diagnosed in 1988. Hypertension. Nocturnal anxiety attacks. Osteoarthritis. Uterine fibroids. Osteoporosis.  Family history: Sister diagnosed with colon cancer under age 5  Exam: The patient is alert and lying comfortably on the endoscopy stretcher. Abdomen is soft and nontender to palpation. Lungs are clear to auscultation. Cardiac exam reveals a regular rhythm.  Plan: Proceed with screening colonoscopy

## 2015-10-26 NOTE — Op Note (Signed)
Procedure: Screening colonoscopy. Sister diagnosed with colon cancer under age 73. March 2006 and March 2011 normal screening colonoscopies performed  Endoscopist: Earle Gell  Premedication: Propofol administered by anesthesia  Procedure: The patient was placed in the left lateral decubitus position. Anal inspection and digital rectal exam were normal. The Pentax pediatric colonoscope was introduced into the rectum and advanced to the cecum. A normal-appearing ileocecal valve and appendiceal orifice were identified. Colonic preparation for the exam today was good. Withdrawal time was 8 minutes  Rectum. Normal. Retroflexed view of the distal rectum was normal  Sigmoid colon and descending colon. Normal  Splenic flexure. Normal  Transverse colon. Normal  Hepatic flexure. Normal  Ascending colon. Normal  Cecum and ileocecal valve. Normal  Assessment: Normal screening colonoscopy  Recommendation: I do not recommend scheduling a repeat screening colonoscopy

## 2015-10-26 NOTE — Discharge Instructions (Signed)
Colonoscopy, Care After °Refer to this sheet in the next few weeks. These instructions provide you with information on caring for yourself after your procedure. Your health care provider may also give you more specific instructions. Your treatment has been planned according to current medical practices, but problems sometimes occur. Call your health care provider if you have any problems or questions after your procedure. °WHAT TO EXPECT AFTER THE PROCEDURE  °After your procedure, it is typical to have the following: °· A small amount of blood in your stool. °· Moderate amounts of gas and mild abdominal cramping or bloating. °HOME CARE INSTRUCTIONS °· Do not drive, operate machinery, or sign important documents for 24 hours. °· You may shower and resume your regular physical activities, but move at a slower pace for the first 24 hours. °· Take frequent rest periods for the first 24 hours. °· Walk around or put a warm pack on your abdomen to help reduce abdominal cramping and bloating. °· Drink enough fluids to keep your urine clear or pale yellow. °· You may resume your normal diet as instructed by your health care provider. Avoid heavy or fried foods that are hard to digest. °· Avoid drinking alcohol for 24 hours or as instructed by your health care provider. °· Only take over-the-counter or prescription medicines as directed by your health care provider. °· If a tissue sample (biopsy) was taken during your procedure: °¨ Do not take aspirin or blood thinners for 7 days, or as instructed by your health care provider. °¨ Do not drink alcohol for 7 days, or as instructed by your health care provider. °¨ Eat soft foods for the first 24 hours. °SEEK MEDICAL CARE IF: °You have persistent spotting of blood in your stool 2-3 days after the procedure. °SEEK IMMEDIATE MEDICAL CARE IF: °· You have more than a small spotting of blood in your stool. °· You pass large blood clots in your stool. °· Your abdomen is swollen  (distended). °· You have nausea or vomiting. °· You have a fever. °· You have increasing abdominal pain that is not relieved with medicine. °  °This information is not intended to replace advice given to you by your health care provider. Make sure you discuss any questions you have with your health care provider. °  °Document Released: 05/16/2004 Document Revised: 07/23/2013 Document Reviewed: 06/09/2013 °Elsevier Interactive Patient Education ©2016 [employer] ° °

## 2015-10-26 NOTE — Anesthesia Postprocedure Evaluation (Signed)
Anesthesia Post Note  Patient: Chelsey Huff  Procedure(s) Performed: Procedure(s) (LRB): COLONOSCOPY WITH PROPOFOL (N/A)  Patient location during evaluation: PACU Anesthesia Type: MAC Level of consciousness: awake and alert Pain management: pain level controlled Vital Signs Assessment: post-procedure vital signs reviewed and stable Respiratory status: spontaneous breathing, nonlabored ventilation, respiratory function stable and patient connected to nasal cannula oxygen Cardiovascular status: stable and blood pressure returned to baseline Anesthetic complications: no    Last Vitals:  Filed Vitals:   10/26/15 0822 10/26/15 0950  BP: 119/97 110/43  Pulse: 71 69  Temp: 36.6 C 36.7 C  Resp: 12 10    Last Pain: There were no vitals filed for this visit.               Catalina Gravel

## 2015-10-26 NOTE — Anesthesia Preprocedure Evaluation (Addendum)
Anesthesia Evaluation  Patient identified by MRN, date of birth, ID band Patient awake    Reviewed: Allergy & Precautions, NPO status , Patient's Chart, lab work & pertinent test results  History of Anesthesia Complications Negative for: history of anesthetic complications  Airway Mallampati: II  TM Distance: <3 FB Neck ROM: Full    Dental  (+) Teeth Intact, Dental Advisory Given   Pulmonary neg pulmonary ROS,    Pulmonary exam normal breath sounds clear to auscultation       Cardiovascular Exercise Tolerance: Good hypertension, Pt. on medications (-) angina(-) CAD and (-) Past MI Normal cardiovascular exam Rhythm:Regular Rate:Normal     Neuro/Psych negative neurological ROS  negative psych ROS   GI/Hepatic negative GI ROS, Neg liver ROS,   Endo/Other  negative endocrine ROS  Renal/GU negative Renal ROS     Musculoskeletal negative musculoskeletal ROS (+)   Abdominal   Peds  Hematology negative hematology ROS (+)   Anesthesia Other Findings Day of surgery medications reviewed with the patient.  Reproductive/Obstetrics negative OB ROS                         Anesthesia Physical Anesthesia Plan  ASA: II  Anesthesia Plan: MAC   Post-op Pain Management:    Induction: Intravenous  Airway Management Planned: Simple Face Mask  Additional Equipment:   Intra-op Plan:   Post-operative Plan:   Informed Consent: I have reviewed the patients History and Physical, chart, labs and discussed the procedure including the risks, benefits and alternatives for the proposed anesthesia with the patient or authorized representative who has indicated his/her understanding and acceptance.   Dental advisory given  Plan Discussed with: CRNA and Anesthesiologist  Anesthesia Plan Comments: (Discussed risks/benefits/alternatives to MAC sedation including need for ventilatory support, hypotension, need  for conversion to general anesthesia.  All patient questions answered.  Patient wished to proceed.)       Anesthesia Quick Evaluation

## 2015-10-26 NOTE — Transfer of Care (Signed)
Immediate Anesthesia Transfer of Care Note  Patient: Chelsey Huff  Procedure(s) Performed: Procedure(s): COLONOSCOPY WITH PROPOFOL (N/A)  Patient Location: PACU and Endoscopy Unit  Anesthesia Type:MAC  Level of Consciousness: awake, alert , oriented and patient cooperative  Airway & Oxygen Therapy: Patient Spontanous Breathing and Patient connected to face mask oxygen  Post-op Assessment: Report given to RN, Post -op Vital signs reviewed and stable and Patient moving all extremities  Post vital signs: Reviewed and stable  Last Vitals:  Filed Vitals:   10/26/15 0822  BP: 119/97  Pulse: 71  Temp: 36.6 C  Resp: 12    Complications: No apparent anesthesia complications

## 2015-10-26 NOTE — Anesthesia Procedure Notes (Signed)
Procedure Name: MAC Date/Time: 10/26/2015 9:25 AM Performed by: Carleene Cooper A Pre-anesthesia Checklist: Patient identified, Timeout performed, Emergency Drugs available, Suction available and Patient being monitored Oxygen Delivery Method: Circle system utilized Dental Injury: Teeth and Oropharynx as per pre-operative assessment

## 2015-10-27 ENCOUNTER — Encounter (HOSPITAL_COMMUNITY): Payer: Self-pay | Admitting: Gastroenterology

## 2016-03-01 DIAGNOSIS — F5104 Psychophysiologic insomnia: Secondary | ICD-10-CM | POA: Diagnosis not present

## 2016-03-01 DIAGNOSIS — F411 Generalized anxiety disorder: Secondary | ICD-10-CM | POA: Diagnosis not present

## 2016-03-01 DIAGNOSIS — M810 Age-related osteoporosis without current pathological fracture: Secondary | ICD-10-CM | POA: Diagnosis not present

## 2016-03-01 DIAGNOSIS — K219 Gastro-esophageal reflux disease without esophagitis: Secondary | ICD-10-CM | POA: Diagnosis not present

## 2016-03-02 DIAGNOSIS — H2513 Age-related nuclear cataract, bilateral: Secondary | ICD-10-CM | POA: Diagnosis not present

## 2016-04-21 DIAGNOSIS — L720 Epidermal cyst: Secondary | ICD-10-CM | POA: Diagnosis not present

## 2016-05-08 DIAGNOSIS — I10 Essential (primary) hypertension: Secondary | ICD-10-CM | POA: Diagnosis not present

## 2016-05-08 DIAGNOSIS — Z6826 Body mass index (BMI) 26.0-26.9, adult: Secondary | ICD-10-CM | POA: Diagnosis not present

## 2016-05-08 DIAGNOSIS — R42 Dizziness and giddiness: Secondary | ICD-10-CM | POA: Diagnosis not present

## 2016-05-08 DIAGNOSIS — F411 Generalized anxiety disorder: Secondary | ICD-10-CM | POA: Diagnosis not present

## 2016-05-18 DIAGNOSIS — H01004 Unspecified blepharitis left upper eyelid: Secondary | ICD-10-CM | POA: Diagnosis not present

## 2016-05-18 DIAGNOSIS — H01002 Unspecified blepharitis right lower eyelid: Secondary | ICD-10-CM | POA: Diagnosis not present

## 2016-05-18 DIAGNOSIS — H01001 Unspecified blepharitis right upper eyelid: Secondary | ICD-10-CM | POA: Diagnosis not present

## 2016-05-18 DIAGNOSIS — H01005 Unspecified blepharitis left lower eyelid: Secondary | ICD-10-CM | POA: Diagnosis not present

## 2016-05-19 DIAGNOSIS — R8299 Other abnormal findings in urine: Secondary | ICD-10-CM | POA: Diagnosis not present

## 2016-05-19 DIAGNOSIS — F411 Generalized anxiety disorder: Secondary | ICD-10-CM | POA: Diagnosis not present

## 2016-05-19 DIAGNOSIS — N390 Urinary tract infection, site not specified: Secondary | ICD-10-CM | POA: Diagnosis not present

## 2016-05-19 DIAGNOSIS — R42 Dizziness and giddiness: Secondary | ICD-10-CM | POA: Diagnosis not present

## 2016-05-19 DIAGNOSIS — M810 Age-related osteoporosis without current pathological fracture: Secondary | ICD-10-CM | POA: Diagnosis not present

## 2016-05-19 DIAGNOSIS — Z0000 Encounter for general adult medical examination without abnormal findings: Secondary | ICD-10-CM | POA: Diagnosis not present

## 2016-05-19 DIAGNOSIS — Z6826 Body mass index (BMI) 26.0-26.9, adult: Secondary | ICD-10-CM | POA: Diagnosis not present

## 2016-05-31 DIAGNOSIS — M810 Age-related osteoporosis without current pathological fracture: Secondary | ICD-10-CM | POA: Diagnosis not present

## 2016-05-31 DIAGNOSIS — Z0000 Encounter for general adult medical examination without abnormal findings: Secondary | ICD-10-CM | POA: Diagnosis not present

## 2016-05-31 DIAGNOSIS — R7301 Impaired fasting glucose: Secondary | ICD-10-CM | POA: Diagnosis not present

## 2016-05-31 DIAGNOSIS — Z23 Encounter for immunization: Secondary | ICD-10-CM | POA: Diagnosis not present

## 2016-05-31 DIAGNOSIS — Z1389 Encounter for screening for other disorder: Secondary | ICD-10-CM | POA: Diagnosis not present

## 2016-05-31 DIAGNOSIS — K219 Gastro-esophageal reflux disease without esophagitis: Secondary | ICD-10-CM | POA: Diagnosis not present

## 2016-05-31 DIAGNOSIS — F5104 Psychophysiologic insomnia: Secondary | ICD-10-CM | POA: Diagnosis not present

## 2016-05-31 DIAGNOSIS — F411 Generalized anxiety disorder: Secondary | ICD-10-CM | POA: Diagnosis not present

## 2016-06-01 DIAGNOSIS — L570 Actinic keratosis: Secondary | ICD-10-CM | POA: Diagnosis not present

## 2016-06-01 DIAGNOSIS — L720 Epidermal cyst: Secondary | ICD-10-CM | POA: Diagnosis not present

## 2016-06-01 DIAGNOSIS — L814 Other melanin hyperpigmentation: Secondary | ICD-10-CM | POA: Diagnosis not present

## 2016-06-01 DIAGNOSIS — D1801 Hemangioma of skin and subcutaneous tissue: Secondary | ICD-10-CM | POA: Diagnosis not present

## 2016-06-01 DIAGNOSIS — D485 Neoplasm of uncertain behavior of skin: Secondary | ICD-10-CM | POA: Diagnosis not present

## 2016-06-01 DIAGNOSIS — L821 Other seborrheic keratosis: Secondary | ICD-10-CM | POA: Diagnosis not present

## 2016-09-01 DIAGNOSIS — Z1231 Encounter for screening mammogram for malignant neoplasm of breast: Secondary | ICD-10-CM | POA: Diagnosis not present

## 2016-09-01 DIAGNOSIS — Z803 Family history of malignant neoplasm of breast: Secondary | ICD-10-CM | POA: Diagnosis not present

## 2016-10-27 ENCOUNTER — Encounter: Payer: BLUE CROSS/BLUE SHIELD | Admitting: Internal Medicine

## 2016-11-29 ENCOUNTER — Encounter (INDEPENDENT_AMBULATORY_CARE_PROVIDER_SITE_OTHER): Payer: Self-pay | Admitting: Orthopaedic Surgery

## 2016-11-29 DIAGNOSIS — M810 Age-related osteoporosis without current pathological fracture: Secondary | ICD-10-CM | POA: Diagnosis not present

## 2016-11-29 DIAGNOSIS — E7800 Pure hypercholesterolemia, unspecified: Secondary | ICD-10-CM | POA: Diagnosis not present

## 2016-11-29 DIAGNOSIS — F5104 Psychophysiologic insomnia: Secondary | ICD-10-CM | POA: Diagnosis not present

## 2016-11-29 DIAGNOSIS — I10 Essential (primary) hypertension: Secondary | ICD-10-CM | POA: Diagnosis not present

## 2016-11-29 DIAGNOSIS — R7301 Impaired fasting glucose: Secondary | ICD-10-CM | POA: Diagnosis not present

## 2016-11-30 ENCOUNTER — Ambulatory Visit (INDEPENDENT_AMBULATORY_CARE_PROVIDER_SITE_OTHER): Payer: Self-pay

## 2016-11-30 ENCOUNTER — Ambulatory Visit (INDEPENDENT_AMBULATORY_CARE_PROVIDER_SITE_OTHER): Payer: BLUE CROSS/BLUE SHIELD | Admitting: Orthopaedic Surgery

## 2016-11-30 ENCOUNTER — Encounter (INDEPENDENT_AMBULATORY_CARE_PROVIDER_SITE_OTHER): Payer: Self-pay | Admitting: Orthopaedic Surgery

## 2016-11-30 VITALS — BP 125/68 | HR 69 | Resp 14 | Ht 65.0 in | Wt 142.0 lb

## 2016-11-30 DIAGNOSIS — M79642 Pain in left hand: Secondary | ICD-10-CM | POA: Diagnosis not present

## 2016-11-30 DIAGNOSIS — S63112A Subluxation of metacarpophalangeal joint of left thumb, initial encounter: Secondary | ICD-10-CM | POA: Diagnosis not present

## 2016-11-30 DIAGNOSIS — M79641 Pain in right hand: Secondary | ICD-10-CM

## 2016-11-30 DIAGNOSIS — M79674 Pain in right toe(s): Secondary | ICD-10-CM

## 2016-11-30 MED ORDER — LIDOCAINE HCL 1 % IJ SOLN
1.0000 mL | INTRAMUSCULAR | Status: AC | PRN
  Administered 2016-11-30: 1 mL

## 2016-11-30 MED ORDER — METHYLPREDNISOLONE ACETATE 40 MG/ML IJ SUSP
20.0000 mg | INTRAMUSCULAR | Status: AC | PRN
  Administered 2016-11-30: 20 mg

## 2016-11-30 MED ORDER — METHYLPREDNISOLONE ACETATE 40 MG/ML IJ SUSP
40.0000 mg | INTRAMUSCULAR | Status: AC | PRN
  Administered 2016-11-30: 40 mg via INTRA_ARTICULAR

## 2016-11-30 NOTE — Progress Notes (Signed)
Office Visit Note   Patient: Chelsey Huff           Date of Birth: June 05, 1941           MRN: [id number] Visit Date: 11/30/2016              Requested by: Haywood Pao, MD [address] [place], [place] [address] PCP: Haywood Pao, MD   Assessment & Plan: Visit Diagnoses:  Right foot second metatarsal phlangeal joint synovitis.  Bilateral basilar thumb osteoarthritis  Plan:inject left thumb and right foot, splint to right thumb, office prn   Follow-Up Instructions: No Follow-up on file.   Orders:  No orders of the defined types were placed in this encounter.  No orders of the defined types were placed in this encounter.     Procedures: Hand/UE Inj Date/Time: 11/30/2016 3:06 PM Performed by: Garald Balding Authorized by: Garald Balding   Consent Given by:  Patient Timeout: prior to procedure the correct patient, procedure, and site was verified   Indications:  Pain Condition: osteoarthritis   Location:  Thumb Site:  L thumb MCP Needle Size:  27 G Approach:  Dorsal Ultrasound Guidance: No   Medications:  1 mL lidocaine 1 %; 20 mg methylPREDNISolone acetate 40 MG/ML   Small Joint Inj Date/Time: 11/30/2016 3:11 PM Performed by: Garald Balding Authorized by: Garald Balding   Consent Given by:  Patient Timeout: prior to procedure the correct patient, procedure, and site was verified   Indications:  Pain Location:  Second toe Site:  R second MTP Needle Size:  27 G Approach:  Dorsal Ultrasound Guided: No   Fluoroscopic Guidance: No   Medications:  1 mL lidocaine 1 %; 40 mg methylPREDNISolone acetate 40 MG/ML     Clinical Data: No additional findings.   Subjective: Chief Complaint  Patient presents with  . Right 2nd Toe - Pain    Pt presents with pain in 2nd toe on Right foot, mostly into the ball of her foot but also if pressing the front area hurts. She is not able ti wear any of her "100 pairs" of shoes for comfort. She  is very walker. Pain for 2 months.  Also at her PCP exam, she was told she had OA in both thumbs. (FYI)    Review of Systems   Objective: Vital Signs: Ht 5\' 5"  (1.651 m)   Wt 150 lb (68 kg)   BMI 24.96 kg/m   Physical Exam  Ortho Exam exam of both hands demonstrates arthritis at the base of both thumbs.. Positive grind test and partial subluxation of the base of the thumb. Skin intact. Neurovascular exam intact. Good grip. Examination the right foot reveals some tenderness at the metatarsal phalangeal joint of the second toe. Toe was not swollen. Good capillary refill. Neurovascular exam intact.  Specialty Comments:  No specialty comments available.  Imaging: No results found.   PMFS History: Patient Active Problem List   Diagnosis Date Noted  . Fibroids   . Osteoporosis   . Hypertension   . Atrophic vaginitis    Past Medical History:  Diagnosis Date  . Atrophic vaginitis   . Fibroids   . Hypertension   . Osteoporosis     Family History  Problem Relation Age of Onset  . Colon cancer Sister   . Melanoma Son     Past Surgical History:  Procedure Laterality Date  . ABDOMINAL HYSTERECTOMY    . COLONOSCOPY WITH PROPOFOL N/A  10/26/2015   Procedure: COLONOSCOPY WITH PROPOFOL;  Surgeon: Garlan Fair, MD;  Location: [facility];  Service: Endoscopy;  Laterality: N/A;  . TOTAL ABDOMINAL HYSTERECTOMY W/ BILATERAL SALPINGOOPHORECTOMY  1999   Social History   Occupational History  . Not on file.   Social History Main Topics  . Smoking status: Never Smoker  . Smokeless tobacco: Never Used  . Alcohol use 3.5 oz/week    7 Standard drinks or equivalent per week  . Drug use: No  . Sexual activity: Yes    Birth control/ protection: Post-menopausal, Surgical

## 2016-12-04 ENCOUNTER — Ambulatory Visit (INDEPENDENT_AMBULATORY_CARE_PROVIDER_SITE_OTHER): Payer: Self-pay | Admitting: Orthopaedic Surgery

## 2017-02-01 DIAGNOSIS — L820 Inflamed seborrheic keratosis: Secondary | ICD-10-CM | POA: Diagnosis not present

## 2017-03-07 DIAGNOSIS — H01004 Unspecified blepharitis left upper eyelid: Secondary | ICD-10-CM | POA: Diagnosis not present

## 2017-03-07 DIAGNOSIS — H353131 Nonexudative age-related macular degeneration, bilateral, early dry stage: Secondary | ICD-10-CM | POA: Diagnosis not present

## 2017-03-07 DIAGNOSIS — H01001 Unspecified blepharitis right upper eyelid: Secondary | ICD-10-CM | POA: Diagnosis not present

## 2017-03-07 DIAGNOSIS — H01002 Unspecified blepharitis right lower eyelid: Secondary | ICD-10-CM | POA: Diagnosis not present

## 2017-03-27 DIAGNOSIS — L821 Other seborrheic keratosis: Secondary | ICD-10-CM | POA: Diagnosis not present

## 2017-05-28 DIAGNOSIS — Z0000 Encounter for general adult medical examination without abnormal findings: Secondary | ICD-10-CM | POA: Diagnosis not present

## 2017-05-28 DIAGNOSIS — M810 Age-related osteoporosis without current pathological fracture: Secondary | ICD-10-CM | POA: Diagnosis not present

## 2017-05-28 DIAGNOSIS — R7301 Impaired fasting glucose: Secondary | ICD-10-CM | POA: Diagnosis not present

## 2017-05-28 DIAGNOSIS — E7800 Pure hypercholesterolemia, unspecified: Secondary | ICD-10-CM | POA: Diagnosis not present

## 2017-06-01 DIAGNOSIS — Z1389 Encounter for screening for other disorder: Secondary | ICD-10-CM | POA: Diagnosis not present

## 2017-06-01 DIAGNOSIS — K219 Gastro-esophageal reflux disease without esophagitis: Secondary | ICD-10-CM | POA: Diagnosis not present

## 2017-06-01 DIAGNOSIS — M810 Age-related osteoporosis without current pathological fracture: Secondary | ICD-10-CM | POA: Diagnosis not present

## 2017-06-01 DIAGNOSIS — F411 Generalized anxiety disorder: Secondary | ICD-10-CM | POA: Diagnosis not present

## 2017-06-01 DIAGNOSIS — Z0000 Encounter for general adult medical examination without abnormal findings: Secondary | ICD-10-CM | POA: Diagnosis not present

## 2017-06-01 DIAGNOSIS — Z23 Encounter for immunization: Secondary | ICD-10-CM | POA: Diagnosis not present

## 2017-06-01 DIAGNOSIS — F5104 Psychophysiologic insomnia: Secondary | ICD-10-CM | POA: Diagnosis not present

## 2017-06-06 DIAGNOSIS — D2261 Melanocytic nevi of right upper limb, including shoulder: Secondary | ICD-10-CM | POA: Diagnosis not present

## 2017-06-06 DIAGNOSIS — C44722 Squamous cell carcinoma of skin of right lower limb, including hip: Secondary | ICD-10-CM | POA: Diagnosis not present

## 2017-06-06 DIAGNOSIS — D2262 Melanocytic nevi of left upper limb, including shoulder: Secondary | ICD-10-CM | POA: Diagnosis not present

## 2017-06-06 DIAGNOSIS — L814 Other melanin hyperpigmentation: Secondary | ICD-10-CM | POA: Diagnosis not present

## 2017-06-06 DIAGNOSIS — D0471 Carcinoma in situ of skin of right lower limb, including hip: Secondary | ICD-10-CM | POA: Diagnosis not present

## 2017-06-06 DIAGNOSIS — L821 Other seborrheic keratosis: Secondary | ICD-10-CM | POA: Diagnosis not present

## 2017-06-06 DIAGNOSIS — L570 Actinic keratosis: Secondary | ICD-10-CM | POA: Diagnosis not present

## 2017-10-18 DIAGNOSIS — H353131 Nonexudative age-related macular degeneration, bilateral, early dry stage: Secondary | ICD-10-CM | POA: Diagnosis not present

## 2017-10-18 DIAGNOSIS — H01001 Unspecified blepharitis right upper eyelid: Secondary | ICD-10-CM | POA: Diagnosis not present

## 2017-10-18 DIAGNOSIS — H35373 Puckering of macula, bilateral: Secondary | ICD-10-CM | POA: Diagnosis not present

## 2017-10-18 DIAGNOSIS — H2513 Age-related nuclear cataract, bilateral: Secondary | ICD-10-CM | POA: Diagnosis not present

## 2017-12-06 DIAGNOSIS — Z1231 Encounter for screening mammogram for malignant neoplasm of breast: Secondary | ICD-10-CM | POA: Diagnosis not present

## 2018-02-08 ENCOUNTER — Encounter (INDEPENDENT_AMBULATORY_CARE_PROVIDER_SITE_OTHER): Payer: Self-pay | Admitting: Orthopaedic Surgery

## 2018-02-08 ENCOUNTER — Ambulatory Visit (INDEPENDENT_AMBULATORY_CARE_PROVIDER_SITE_OTHER): Payer: BLUE CROSS/BLUE SHIELD | Admitting: Orthopaedic Surgery

## 2018-02-08 VITALS — BP 164/82 | HR 69 | Resp 18 | Ht 61.5 in | Wt 145.0 lb

## 2018-02-08 DIAGNOSIS — M65341 Trigger finger, right ring finger: Secondary | ICD-10-CM

## 2018-02-08 NOTE — Progress Notes (Signed)
Office Visit Note   Patient: Chelsey Huff           Date of Birth: 08/30/1941           MRN: [id number] Visit Date: 02/08/2018              Requested by: Haywood Pao, MD [address] [place], [place] [address] PCP: Haywood Pao, MD   Assessment & Plan: Visit Diagnoses:  1. Trigger finger, right ring finger    [name] is here for evaluation of several problems .the biggest issue is with triggering of the right ring finger.  Discussed different treatment options including splinting, injection and surgery.  She will let me know.  She also has arthritis at the base of both thumbs which are presently comfortable.  She also is experiencing some mild discomfort with a right second hammertoe.  She would like to consider surgery but I think that the deformity is so mild and she is not experiencing that much pain that she might just want to try a compression device which I  Will put in her shoe to straighten the toe when she is wearing shoes.  We will plan to see her back as necessary. Also discussed back exercises today as she has had some chronic discomfort but without radiculopathy  Follow-Up Instructions: Return if symptoms worsen or fail to improve.   Orders:  No orders of the defined types were placed in this encounter.  No orders of the defined types were placed in this encounter.     Procedures: No procedures performed   Clinical Data: No additional findings.   Subjective: Chief Complaint  Patient presents with  . New Patient (Initial Visit)    r ring trigger finger for few months, right 2nd toe hammer toe for 1 yr, pulled muscles in back for 1 month get better then will pull it again taking advil  With no history of injury or trauma.  Has active triggering of the right ring finger.  Also has chronic arthritis at the base of both thumbs. Has had some issues off and on with back pain without radiculopathy.  Has been performing exercises  HPI  Review of  Systems   Objective: Vital Signs: BP (!) 164/82 (BP Location: Left Arm, Patient Position: Sitting, Cuff Size: Normal)   Pulse 69   Resp 18   Ht 5' 1.5" (1.562 m)   Wt 145 lb (65.8 kg)   BMI 26.95 kg/m   Physical Exam  Constitutional: She is oriented to person, place, and time. She appears well-developed and well-nourished.  Eyes: Pupils are equal, round, and reactive to light. EOM are normal.  Pulmonary/Chest: Effort normal.  Neurological: She is alert and oriented to person, place, and time.  Skin: Skin is warm and dry.  Psychiatric: She has a normal mood and affect. Her behavior is normal.    Ortho Exam awake alert and oriented x3.  Comfortable sitting.  Could not actively trigger the right ring finger today but there is some tenderness on the volar aspect near the metacarpal phalangeal joint.  Full flexion extension.  No swelling of the digits.  Good capillary refill.  Has obvious degenerative change at the base of both thumbs with some subluxation and decreased grip.  Sensory exam intact. Straight leg raise negative bilaterally.  Walks without a limp.  Significant percussible tenderness of the lumbar spine  Specialty Comments:  No specialty comments available.  Imaging: No results found.   PMFS History: Patient  Active Problem List   Diagnosis Date Noted  . Fibroids   . Osteoporosis   . Hypertension   . Atrophic vaginitis    Past Medical History:  Diagnosis Date  . Atrophic vaginitis   . Fibroids   . Hypertension   . Osteoporosis     Family History  Problem Relation Age of Onset  . Colon cancer Sister   . Melanoma Son     Past Surgical History:  Procedure Laterality Date  . ABDOMINAL HYSTERECTOMY    . COLONOSCOPY WITH PROPOFOL N/A 10/26/2015   Procedure: COLONOSCOPY WITH PROPOFOL;  Surgeon: Garlan Fair, MD;  Location: [facility];  Service: Endoscopy;  Laterality: N/A;  . TOTAL ABDOMINAL HYSTERECTOMY W/ BILATERAL SALPINGOOPHORECTOMY  1999   Social  History   Occupational History  . Not on file  Tobacco Use  . Smoking status: Never Smoker  . Smokeless tobacco: Never Used  Substance and Sexual Activity  . Alcohol use: Yes    Alcohol/week: 3.5 oz    Types: 7 Standard drinks or equivalent per week  . Drug use: No  . Sexual activity: Yes    Birth control/protection: Post-menopausal, Surgical

## 2018-04-03 DIAGNOSIS — H43811 Vitreous degeneration, right eye: Secondary | ICD-10-CM | POA: Diagnosis not present

## 2018-04-03 DIAGNOSIS — H34832 Tributary (branch) retinal vein occlusion, left eye, with macular edema: Secondary | ICD-10-CM | POA: Diagnosis not present

## 2018-04-03 DIAGNOSIS — H35373 Puckering of macula, bilateral: Secondary | ICD-10-CM | POA: Diagnosis not present

## 2018-04-03 DIAGNOSIS — H35362 Drusen (degenerative) of macula, left eye: Secondary | ICD-10-CM | POA: Diagnosis not present

## 2018-04-23 ENCOUNTER — Ambulatory Visit (INDEPENDENT_AMBULATORY_CARE_PROVIDER_SITE_OTHER): Payer: BLUE CROSS/BLUE SHIELD | Admitting: Orthopaedic Surgery

## 2018-04-23 ENCOUNTER — Encounter (INDEPENDENT_AMBULATORY_CARE_PROVIDER_SITE_OTHER): Payer: Self-pay | Admitting: Orthopaedic Surgery

## 2018-04-23 ENCOUNTER — Ambulatory Visit (INDEPENDENT_AMBULATORY_CARE_PROVIDER_SITE_OTHER): Payer: Self-pay

## 2018-04-23 ENCOUNTER — Other Ambulatory Visit (INDEPENDENT_AMBULATORY_CARE_PROVIDER_SITE_OTHER): Payer: Self-pay | Admitting: Radiology

## 2018-04-23 VITALS — BP 130/60 | HR 73 | Resp 16 | Ht 64.75 in | Wt 148.0 lb

## 2018-04-23 DIAGNOSIS — M25561 Pain in right knee: Secondary | ICD-10-CM

## 2018-04-23 DIAGNOSIS — G8929 Other chronic pain: Secondary | ICD-10-CM

## 2018-04-23 DIAGNOSIS — M79641 Pain in right hand: Secondary | ICD-10-CM

## 2018-04-23 DIAGNOSIS — M79644 Pain in right finger(s): Secondary | ICD-10-CM | POA: Diagnosis not present

## 2018-04-23 NOTE — Progress Notes (Signed)
Office Visit Note   Patient: Chelsey Huff           Date of Birth: June 16, 1941           MRN: [id number] Visit Date: 04/23/2018              Requested by: Haywood Pao, MD [address] [place], [place] [address] PCP: Haywood Pao, MD   Assessment & Plan: Visit Diagnoses:  1. Acute pain of right knee   2. Chronic pain of right thumb     Plan: Advanced osteoarthritis at the base of the right thumb.  Long discussion.  I think the best approach may be surgical.  I will refer her to Dr. Fredna Dow. Also has arthritis of her right knee with feeling of her knee giving way.  Long discussion regarding treatment options with NSAIDs, bracing and cortisone injections.  We will try the spider brace and have her return as needed  Follow-Up Instructions: Return if symptoms worsen or fail to improve.   Orders:  Orders Placed This Encounter  Procedures  . XR KNEE 3 VIEW RIGHT  . XR Hand Complete Right   No orders of the defined types were placed in this encounter.     Procedures: No procedures performed   Clinical Data: No additional findings.   Subjective: Chief Complaint  Patient presents with  . Right Knee - Pain  . Right Thumb - Pain  . Hand Pain    Right thumb pain x 1 year, had injection, pain on/off, improving with Aleve and brace, no injury, swelling, no surgery to right thumb, not diabetic  . Knee Pain    Knee pain x 6 days, giving out, catching, no injury, no surgery to right knee  Has an established diagnosis of osteoarthritis at the base of the right thumb by prior films.  Repeat films today reveal significant degenerative changes with cyst formation of both sides of the joint and subluxation.  Chelsey Huff is having considerable trouble with her activities of daily living as it is her dominant hand.  She has tried meds and even a small brace. In addition she has had some discomfort recently with her right knee.  She and her husband were traveling to [facility] she experienced pain.  No specific injury or trauma.  She is had a feeling of her knee "catching".  HPI  Review of Systems  Constitutional: Positive for activity change.  HENT: Negative for trouble swallowing.   Eyes: Negative for pain.  Respiratory: Negative for shortness of breath.   Cardiovascular: Positive for leg swelling.  Gastrointestinal: Negative for constipation.  Endocrine: Positive for cold intolerance.  Genitourinary: Negative for difficulty urinating.  Musculoskeletal: Positive for gait problem.  Skin: Negative for rash.  Allergic/Immunologic: Negative for food allergies.  Neurological: Negative for weakness.  Hematological: Does not bruise/bleed easily.  Psychiatric/Behavioral: Negative for sleep disturbance.     Objective: Vital Signs: BP 130/60 (BP Location: Left Arm, Patient Position: Sitting, Cuff Size: Normal)   Pulse 73   Resp 16   Ht 5' 4.75" (1.645 m)   Wt 148 lb (67.1 kg)   BMI 24.82 kg/m   Physical Exam  Constitutional: She is oriented to person, place, and time. She appears well-developed and well-nourished.  HENT:  Mouth/Throat: Oropharynx is clear and moist.  Eyes: Pupils are equal, round, and reactive to light. EOM are normal.  Pulmonary/Chest: Effort normal.  Neurological: She is alert and oriented to person, place, and time.  Skin: Skin is warm and dry.  Psychiatric: She has a normal mood and affect. Her behavior is normal.    Ortho Exam awake alert and oriented x3.  On examination of the right thumb there was positive grind test.  There is obvious partial subluxation at the thumb metacarpal carpal joint with some tenderness.  Skin intact.  No effusion.  Neurovascular exam intact.  Some mild pain about the IP joint which also notes arthritis by film. Right knee without effusion.  Positive patellar crepitation and pain with patella compression.  No medial lateral joint pain.  No instability.  Full extension.  Flexed over 100  degrees no popliteal pain.  No distal edema.  Neurovascular exam intact.  Painless range of motion both hips.  Straight leg raise negative Specialty Comments:  No specialty comments available.  Imaging: Xr Hand Complete Right  Result Date: 04/23/2018 Films of the right hand were obtained in several projections.  There is considerable degenerative arthritis at the metacarpal carpal joint of the thumb.  There are cysts on both sides of the joint and subchondral sclerosis and partial subluxation.  No acute changes.  There are also degenerative changes at the IP joint of the thumb with osteophyte formation particularly dorsally  Xr Knee 3 View Right  Result Date: 04/23/2018 Right knee were obtained in 3 projections standing.  There is a moderate amount of arthritis at the patellofemoral joint with some narrowing at the lateral patellofemoral facet osteophyte formation and mild subchondral sclerosis.  Joint spaces are maintained on the standing film in the AP projection with some osteophytes more lateral than medial.  Alignment appears to be neutral.  No ectopic calcification.  Films are consistent with mild osteoarthritis more advanced at the patellofemoral joint    PMFS History: Patient Active Problem List   Diagnosis Date Noted  . Fibroids   . Osteoporosis   . Hypertension   . Atrophic vaginitis    Past Medical History:  Diagnosis Date  . Atrophic vaginitis   . Fibroids   . Hypertension   . Osteoporosis     Family History  Problem Relation Age of Onset  . Colon cancer Sister   . Melanoma Son     Past Surgical History:  Procedure Laterality Date  . ABDOMINAL HYSTERECTOMY    . COLONOSCOPY WITH PROPOFOL N/A 10/26/2015   Procedure: COLONOSCOPY WITH PROPOFOL;  Surgeon: Garlan Fair, MD;  Location: [facility];  Service: Endoscopy;  Laterality: N/A;  . TOTAL ABDOMINAL HYSTERECTOMY W/ BILATERAL SALPINGOOPHORECTOMY  1999   Social History   Occupational History  . Not on file    Tobacco Use  . Smoking status: Never Smoker  . Smokeless tobacco: Never Used  Substance and Sexual Activity  . Alcohol use: Yes    Alcohol/week: 1.8 oz    Types: 3 Glasses of wine per week  . Drug use: No  . Sexual activity: Yes    Birth control/protection: Post-menopausal, Surgical

## 2018-04-24 DIAGNOSIS — D225 Melanocytic nevi of trunk: Secondary | ICD-10-CM | POA: Diagnosis not present

## 2018-04-24 DIAGNOSIS — L430 Hypertrophic lichen planus: Secondary | ICD-10-CM | POA: Diagnosis not present

## 2018-04-24 DIAGNOSIS — L814 Other melanin hyperpigmentation: Secondary | ICD-10-CM | POA: Diagnosis not present

## 2018-04-24 DIAGNOSIS — Z85828 Personal history of other malignant neoplasm of skin: Secondary | ICD-10-CM | POA: Diagnosis not present

## 2018-04-24 DIAGNOSIS — L821 Other seborrheic keratosis: Secondary | ICD-10-CM | POA: Diagnosis not present

## 2018-04-24 DIAGNOSIS — D485 Neoplasm of uncertain behavior of skin: Secondary | ICD-10-CM | POA: Diagnosis not present

## 2018-06-22 DIAGNOSIS — S62609A Fracture of unspecified phalanx of unspecified finger, initial encounter for closed fracture: Secondary | ICD-10-CM

## 2018-06-22 HISTORY — DX: Fracture of unspecified phalanx of unspecified finger, initial encounter for closed fracture: S62.609A

## 2018-07-03 DIAGNOSIS — S62334A Displaced fracture of neck of fourth metacarpal bone, right hand, initial encounter for closed fracture: Secondary | ICD-10-CM | POA: Diagnosis not present

## 2018-07-03 DIAGNOSIS — R52 Pain, unspecified: Secondary | ICD-10-CM | POA: Diagnosis not present

## 2018-07-03 DIAGNOSIS — M25641 Stiffness of right hand, not elsewhere classified: Secondary | ICD-10-CM | POA: Diagnosis not present

## 2018-07-08 DIAGNOSIS — M810 Age-related osteoporosis without current pathological fracture: Secondary | ICD-10-CM | POA: Diagnosis not present

## 2018-07-08 DIAGNOSIS — R7301 Impaired fasting glucose: Secondary | ICD-10-CM | POA: Diagnosis not present

## 2018-07-08 DIAGNOSIS — I10 Essential (primary) hypertension: Secondary | ICD-10-CM | POA: Diagnosis not present

## 2018-07-08 DIAGNOSIS — Z0000 Encounter for general adult medical examination without abnormal findings: Secondary | ICD-10-CM | POA: Diagnosis not present

## 2018-07-08 DIAGNOSIS — R82998 Other abnormal findings in urine: Secondary | ICD-10-CM | POA: Diagnosis not present

## 2018-07-11 DIAGNOSIS — F411 Generalized anxiety disorder: Secondary | ICD-10-CM | POA: Diagnosis not present

## 2018-07-11 DIAGNOSIS — E7800 Pure hypercholesterolemia, unspecified: Secondary | ICD-10-CM | POA: Diagnosis not present

## 2018-07-11 DIAGNOSIS — I10 Essential (primary) hypertension: Secondary | ICD-10-CM | POA: Diagnosis not present

## 2018-07-11 DIAGNOSIS — M1811 Unilateral primary osteoarthritis of first carpometacarpal joint, right hand: Secondary | ICD-10-CM | POA: Diagnosis not present

## 2018-07-11 DIAGNOSIS — Z0000 Encounter for general adult medical examination without abnormal findings: Secondary | ICD-10-CM | POA: Diagnosis not present

## 2018-07-11 DIAGNOSIS — Z23 Encounter for immunization: Secondary | ICD-10-CM | POA: Diagnosis not present

## 2018-07-11 DIAGNOSIS — Z1389 Encounter for screening for other disorder: Secondary | ICD-10-CM | POA: Diagnosis not present

## 2018-07-15 DIAGNOSIS — S62334A Displaced fracture of neck of fourth metacarpal bone, right hand, initial encounter for closed fracture: Secondary | ICD-10-CM | POA: Diagnosis not present

## 2018-07-15 DIAGNOSIS — M25641 Stiffness of right hand, not elsewhere classified: Secondary | ICD-10-CM | POA: Diagnosis not present

## 2018-07-16 DIAGNOSIS — H43811 Vitreous degeneration, right eye: Secondary | ICD-10-CM | POA: Diagnosis not present

## 2018-07-16 DIAGNOSIS — H35373 Puckering of macula, bilateral: Secondary | ICD-10-CM | POA: Diagnosis not present

## 2018-07-16 DIAGNOSIS — H3581 Retinal edema: Secondary | ICD-10-CM | POA: Diagnosis not present

## 2018-07-16 DIAGNOSIS — H34832 Tributary (branch) retinal vein occlusion, left eye, with macular edema: Secondary | ICD-10-CM | POA: Diagnosis not present

## 2018-07-23 DIAGNOSIS — M25641 Stiffness of right hand, not elsewhere classified: Secondary | ICD-10-CM | POA: Diagnosis not present

## 2018-07-23 DIAGNOSIS — L720 Epidermal cyst: Secondary | ICD-10-CM | POA: Diagnosis not present

## 2018-07-29 DIAGNOSIS — R52 Pain, unspecified: Secondary | ICD-10-CM | POA: Diagnosis not present

## 2018-07-29 DIAGNOSIS — S62334D Displaced fracture of neck of fourth metacarpal bone, right hand, subsequent encounter for fracture with routine healing: Secondary | ICD-10-CM | POA: Diagnosis not present

## 2018-07-29 DIAGNOSIS — M25641 Stiffness of right hand, not elsewhere classified: Secondary | ICD-10-CM | POA: Diagnosis not present

## 2018-07-29 DIAGNOSIS — M1811 Unilateral primary osteoarthritis of first carpometacarpal joint, right hand: Secondary | ICD-10-CM | POA: Diagnosis not present

## 2018-08-01 DIAGNOSIS — R52 Pain, unspecified: Secondary | ICD-10-CM | POA: Diagnosis not present

## 2018-08-01 DIAGNOSIS — M25641 Stiffness of right hand, not elsewhere classified: Secondary | ICD-10-CM | POA: Diagnosis not present

## 2018-08-02 ENCOUNTER — Encounter (INDEPENDENT_AMBULATORY_CARE_PROVIDER_SITE_OTHER): Payer: Self-pay | Admitting: Orthopaedic Surgery

## 2018-08-02 ENCOUNTER — Ambulatory Visit (INDEPENDENT_AMBULATORY_CARE_PROVIDER_SITE_OTHER): Payer: BLUE CROSS/BLUE SHIELD | Admitting: Orthopaedic Surgery

## 2018-08-02 VITALS — BP 145/78 | HR 74 | Ht 64.25 in | Wt 150.0 lb

## 2018-08-02 DIAGNOSIS — M1711 Unilateral primary osteoarthritis, right knee: Secondary | ICD-10-CM | POA: Diagnosis not present

## 2018-08-02 DIAGNOSIS — Z4802 Encounter for removal of sutures: Secondary | ICD-10-CM | POA: Diagnosis not present

## 2018-08-02 NOTE — Progress Notes (Signed)
Office Visit Note   Patient: Chelsey Huff           Date of Birth: 01/26/1941           MRN: [id number] Visit Date: 08/02/2018              Requested by: Haywood Pao, MD [address] [place], [place] [address] PCP: Osborne Casco Fransico Him, MD   Assessment & Plan: Visit Diagnoses:  1. Unilateral primary osteoarthritis, right knee     Plan: Long discussion regarding osteoarthritis right knee and trip planning in the next 6 weeks.  Will inject with cortisone about a week before her trip.  Also discussed over 30 minutes the issue with her osteoporosis with a recent change in her bone density study.  Amber, the pharmacist, talk with her for about 15 to 20 minutes regarding the different treatment options.  We will follow-up with her primary care physician  Follow-Up Instructions: Return if symptoms worsen or fail to improve.   Orders:  No orders of the defined types were placed in this encounter.  No orders of the defined types were placed in this encounter.     Procedures: No procedures performed   Clinical Data: No additional findings.   Subjective: Chief Complaint  Patient presents with  . Follow-up    R KNEE PAIN HAD BONE SCAN WOULD LIKE TO DISCUSS OPTIONS  Mrs. Colasurdo is a diagnosis of right knee osteoarthritis and is been having some "more pain recently".  She is planning a trip to [place] in November and wanted to know cortisone.  Discussed the cortisone with her and felt that she probably should she has seen Dr. Fredna Dow recently regards to the arthritis of her right thumb and is been wearing a splint and using creams.  She also has had a repeat bone density study and compared to the study she had in 2017 there is a slight decrease in bone mineralization from -2.6 to -2.8.  Long discussion regarding different treatment options reinforcing her primary care physician's thoughts also had her talk with Amber, the pharmacist which was helpful  HPI  Review of Systems    Constitutional: Negative for diaphoresis and fatigue.  HENT: Negative for ear pain.   Eyes: Negative for pain.  Respiratory: Negative for cough and shortness of breath.   Cardiovascular: Positive for leg swelling.  Gastrointestinal: Negative for constipation and diarrhea.  Genitourinary: Negative for difficulty urinating.  Musculoskeletal: Negative for back pain and neck pain.  Skin: Negative for rash.  Allergic/Immunologic: Negative for food allergies.  Neurological: Positive for weakness. Negative for numbness.  Hematological: Does not bruise/bleed easily.  Psychiatric/Behavioral: Negative for sleep disturbance.     Objective: Vital Signs: BP (!) 145/78 (BP Location: Left Arm, Patient Position: Sitting, Cuff Size: Normal)   Pulse 74   Ht 5' 4.25" (1.632 m)   Wt 150 lb (68 kg)   BMI 25.55 kg/m   Physical Exam  Constitutional: She is oriented to person, place, and time. She appears well-developed and well-nourished.  HENT:  Mouth/Throat: Oropharynx is clear and moist.  Eyes: Pupils are equal, round, and reactive to light. EOM are normal.  Pulmonary/Chest: Effort normal.  Neurological: She is alert and oriented to person, place, and time.  Skin: Skin is warm and dry.  Psychiatric: She has a normal mood and affect. Her behavior is normal.    Ortho Exam right knee without effusion.  Some medial joint tenderness.  Positive patellar crepitation.  No instability.  No calf pain.  Straight leg raise negative.  No pain range of motion of her hip. Specialty Comments:  No specialty comments available.  Imaging: No results found.   PMFS History: Patient Active Problem List   Diagnosis Date Noted  . Fibroids   . Osteoporosis   . Hypertension   . Atrophic vaginitis    Past Medical History:  Diagnosis Date  . Atrophic vaginitis   . Fibroids   . Finger fracture 06/22/2018  . Hypertension   . Osteoporosis     Family History  Problem Relation Age of Onset  . Colon cancer  Sister   . Melanoma Son     Past Surgical History:  Procedure Laterality Date  . ABDOMINAL HYSTERECTOMY    . COLONOSCOPY WITH PROPOFOL N/A 10/26/2015   Procedure: COLONOSCOPY WITH PROPOFOL;  Surgeon: Garlan Fair, MD;  Location: [facility];  Service: Endoscopy;  Laterality: N/A;  . TOTAL ABDOMINAL HYSTERECTOMY W/ BILATERAL SALPINGOOPHORECTOMY  1999   Social History   Occupational History  . Not on file  Tobacco Use  . Smoking status: Never Smoker  . Smokeless tobacco: Never Used  Substance and Sexual Activity  . Alcohol use: Yes    Alcohol/week: 3.0 standard drinks    Types: 3 Glasses of wine per week  . Drug use: No  . Sexual activity: Yes    Birth control/protection: Post-menopausal, Surgical

## 2018-08-06 DIAGNOSIS — R52 Pain, unspecified: Secondary | ICD-10-CM | POA: Diagnosis not present

## 2018-08-06 DIAGNOSIS — M25641 Stiffness of right hand, not elsewhere classified: Secondary | ICD-10-CM | POA: Diagnosis not present

## 2018-08-08 DIAGNOSIS — R52 Pain, unspecified: Secondary | ICD-10-CM | POA: Diagnosis not present

## 2018-08-08 DIAGNOSIS — M25641 Stiffness of right hand, not elsewhere classified: Secondary | ICD-10-CM | POA: Diagnosis not present

## 2018-08-12 DIAGNOSIS — S62334A Displaced fracture of neck of fourth metacarpal bone, right hand, initial encounter for closed fracture: Secondary | ICD-10-CM | POA: Diagnosis not present

## 2018-08-12 DIAGNOSIS — R52 Pain, unspecified: Secondary | ICD-10-CM | POA: Diagnosis not present

## 2018-08-12 DIAGNOSIS — M25641 Stiffness of right hand, not elsewhere classified: Secondary | ICD-10-CM | POA: Diagnosis not present

## 2018-08-14 DIAGNOSIS — M79641 Pain in right hand: Secondary | ICD-10-CM | POA: Diagnosis not present

## 2018-08-14 DIAGNOSIS — S62334A Displaced fracture of neck of fourth metacarpal bone, right hand, initial encounter for closed fracture: Secondary | ICD-10-CM | POA: Diagnosis not present

## 2018-08-15 DIAGNOSIS — R29898 Other symptoms and signs involving the musculoskeletal system: Secondary | ICD-10-CM | POA: Diagnosis not present

## 2018-08-15 DIAGNOSIS — M79641 Pain in right hand: Secondary | ICD-10-CM | POA: Diagnosis not present

## 2018-08-15 DIAGNOSIS — M25641 Stiffness of right hand, not elsewhere classified: Secondary | ICD-10-CM | POA: Diagnosis not present

## 2018-08-15 DIAGNOSIS — S62334A Displaced fracture of neck of fourth metacarpal bone, right hand, initial encounter for closed fracture: Secondary | ICD-10-CM | POA: Diagnosis not present

## 2018-08-20 DIAGNOSIS — M25641 Stiffness of right hand, not elsewhere classified: Secondary | ICD-10-CM | POA: Diagnosis not present

## 2018-08-20 DIAGNOSIS — R29898 Other symptoms and signs involving the musculoskeletal system: Secondary | ICD-10-CM | POA: Diagnosis not present

## 2018-08-20 DIAGNOSIS — S62334A Displaced fracture of neck of fourth metacarpal bone, right hand, initial encounter for closed fracture: Secondary | ICD-10-CM | POA: Diagnosis not present

## 2018-08-20 DIAGNOSIS — M79641 Pain in right hand: Secondary | ICD-10-CM | POA: Diagnosis not present

## 2018-09-06 ENCOUNTER — Ambulatory Visit (INDEPENDENT_AMBULATORY_CARE_PROVIDER_SITE_OTHER): Payer: BLUE CROSS/BLUE SHIELD | Admitting: Orthopaedic Surgery

## 2018-09-06 ENCOUNTER — Encounter (INDEPENDENT_AMBULATORY_CARE_PROVIDER_SITE_OTHER): Payer: Self-pay | Admitting: Orthopaedic Surgery

## 2018-09-06 VITALS — Ht 64.25 in | Wt 150.0 lb

## 2018-09-06 DIAGNOSIS — M1711 Unilateral primary osteoarthritis, right knee: Secondary | ICD-10-CM | POA: Diagnosis not present

## 2018-09-06 MED ORDER — METHYLPREDNISOLONE ACETATE 40 MG/ML IJ SUSP
80.0000 mg | INTRAMUSCULAR | Status: AC | PRN
  Administered 2018-09-06: 80 mg

## 2018-09-06 MED ORDER — ALENDRONATE SODIUM 70 MG PO TABS: ORAL_TABLET | ORAL | 0 refills | Status: DC

## 2018-09-06 MED ORDER — BUPIVACAINE HCL 0.5 % IJ SOLN
2.0000 mL | INTRAMUSCULAR | Status: AC | PRN
  Administered 2018-09-06: 2 mL via INTRA_ARTICULAR

## 2018-09-06 MED ORDER — LIDOCAINE HCL 1 % IJ SOLN
2.0000 mL | INTRAMUSCULAR | Status: AC | PRN
  Administered 2018-09-06: 2 mL

## 2018-09-06 NOTE — Progress Notes (Signed)
Office Visit Note   Patient: Chelsey Huff           Date of Birth: 10/03/1941           MRN: [id number] Visit Date: 09/06/2018              Requested by: Haywood Pao, MD [address] [place], [place] [address] PCP: Haywood Pao, MD   Assessment & Plan: Visit Diagnoses:  1. Unilateral primary osteoarthritis, right knee     Plan: Cortisone injection right knee.  Follow-up as needed Follow-Up Instructions: Return if symptoms worsen or fail to improve.   Orders:  Orders Placed This Encounter  Procedures  . Large Joint Inj: R knee   Meds ordered this encounter  Medications  . alendronate (FOSAMAX) 70 MG tablet    Sig: TK 1 T PO ONCE WEEKLY    Dispense:  12 tablet    Refill:  0      Procedures: Large Joint Inj: R knee on 09/06/2018 9:56 AM Indications: pain and diagnostic evaluation Details: 25 G 1.5 in needle, anteromedial approach  Arthrogram: No  Medications: 2 mL lidocaine 1 %; 2 mL bupivacaine 0.5 %; 80 mg methylPREDNISolone acetate 40 MG/ML Procedure, treatment alternatives, risks and benefits explained, specific risks discussed. Consent was given by the patient. Immediately prior to procedure a time out was called to verify the correct patient, procedure, equipment, support staff and site/side marked as required. Patient was prepped and draped in the usual sterile fashion.       Clinical Data: No additional findings.   Subjective: Chief Complaint  Patient presents with  . Right Knee - Follow-up  Patient returns with right knee pain.  She is here for an injection in her right knee prior to her trip to [place].  Chelsey Huff is planning a trip to [place] with her husband.  I seen her recently for evaluation of her right knee and thought a cortisone injection would be helpful. HPI  Review of Systems   Objective: Vital Signs: Ht 5' 4.25" (1.632 m)   Wt 150 lb (68 kg)   BMI 25.55 kg/m   Physical Exam  Constitutional: She is oriented to  person, place, and time. She appears well-developed and well-nourished.  HENT:  Mouth/Throat: Oropharynx is clear and moist.  Eyes: Pupils are equal, round, and reactive to light. EOM are normal.  Pulmonary/Chest: Effort normal.  Neurological: She is alert and oriented to person, place, and time.  Skin: Skin is warm and dry.  Psychiatric: She has a normal mood and affect. Her behavior is normal.    Ortho Exam right knee without effusion.  Full extension.  Some patellar crepitation.  Predominately medial joint pain.  No calf pain.  Neurovascular exam intact  Specialty Comments:  No specialty comments available.  Imaging: No results found.   PMFS History: Patient Active Problem List   Diagnosis Date Noted  . Unilateral primary osteoarthritis, right knee 09/06/2018  . Fibroids   . Osteoporosis   . Hypertension   . Atrophic vaginitis    Past Medical History:  Diagnosis Date  . Atrophic vaginitis   . Fibroids   . Finger fracture 06/22/2018  . Hypertension   . Osteoporosis     Family History  Problem Relation Age of Onset  . Colon cancer Sister   . Melanoma Son     Past Surgical History:  Procedure Laterality Date  . ABDOMINAL HYSTERECTOMY    . COLONOSCOPY WITH PROPOFOL N/A 10/26/2015  Procedure: COLONOSCOPY WITH PROPOFOL;  Surgeon: Garlan Fair, MD;  Location: [facility];  Service: Endoscopy;  Laterality: N/A;  . dental implant    . TOTAL ABDOMINAL HYSTERECTOMY W/ BILATERAL SALPINGOOPHORECTOMY  1999   Social History   Occupational History  . Not on file  Tobacco Use  . Smoking status: Never Smoker  . Smokeless tobacco: Never Used  Substance and Sexual Activity  . Alcohol use: Yes    Alcohol/week: 3.0 standard drinks    Types: 3 Glasses of wine per week  . Drug use: No  . Sexual activity: Yes    Birth control/protection: Post-menopausal, Surgical

## 2018-10-24 DIAGNOSIS — H01004 Unspecified blepharitis left upper eyelid: Secondary | ICD-10-CM | POA: Diagnosis not present

## 2018-10-24 DIAGNOSIS — H01001 Unspecified blepharitis right upper eyelid: Secondary | ICD-10-CM | POA: Diagnosis not present

## 2018-10-24 DIAGNOSIS — H2513 Age-related nuclear cataract, bilateral: Secondary | ICD-10-CM | POA: Diagnosis not present

## 2018-10-24 DIAGNOSIS — H01002 Unspecified blepharitis right lower eyelid: Secondary | ICD-10-CM | POA: Diagnosis not present

## 2018-11-06 DIAGNOSIS — M810 Age-related osteoporosis without current pathological fracture: Secondary | ICD-10-CM | POA: Diagnosis not present

## 2018-11-06 DIAGNOSIS — F411 Generalized anxiety disorder: Secondary | ICD-10-CM | POA: Diagnosis not present

## 2018-11-06 DIAGNOSIS — I10 Essential (primary) hypertension: Secondary | ICD-10-CM | POA: Diagnosis not present

## 2018-11-06 DIAGNOSIS — F5104 Psychophysiologic insomnia: Secondary | ICD-10-CM | POA: Diagnosis not present

## 2018-11-11 DIAGNOSIS — S62334A Displaced fracture of neck of fourth metacarpal bone, right hand, initial encounter for closed fracture: Secondary | ICD-10-CM | POA: Diagnosis not present

## 2018-11-11 DIAGNOSIS — S62334D Displaced fracture of neck of fourth metacarpal bone, right hand, subsequent encounter for fracture with routine healing: Secondary | ICD-10-CM | POA: Diagnosis not present

## 2018-11-19 DIAGNOSIS — M25562 Pain in left knee: Secondary | ICD-10-CM | POA: Diagnosis not present

## 2018-11-19 DIAGNOSIS — M170 Bilateral primary osteoarthritis of knee: Secondary | ICD-10-CM | POA: Diagnosis not present

## 2018-11-19 DIAGNOSIS — M25552 Pain in left hip: Secondary | ICD-10-CM | POA: Diagnosis not present

## 2018-11-19 DIAGNOSIS — M25561 Pain in right knee: Secondary | ICD-10-CM | POA: Diagnosis not present

## 2018-11-29 DIAGNOSIS — M25552 Pain in left hip: Secondary | ICD-10-CM | POA: Diagnosis not present

## 2018-11-29 DIAGNOSIS — M7062 Trochanteric bursitis, left hip: Secondary | ICD-10-CM | POA: Diagnosis not present

## 2018-12-18 ENCOUNTER — Other Ambulatory Visit (HOSPITAL_COMMUNITY): Payer: Self-pay | Admitting: *Deleted

## 2018-12-19 ENCOUNTER — Ambulatory Visit (HOSPITAL_COMMUNITY)
Admission: RE | Admit: 2018-12-19 | Discharge: 2018-12-19 | Disposition: A | Discharge status: Home / Self Care | Payer: BLUE CROSS/BLUE SHIELD | Source: Ambulatory Visit | Attending: Internal Medicine | Admitting: Internal Medicine

## 2018-12-19 ENCOUNTER — Other Ambulatory Visit: Payer: Self-pay

## 2018-12-19 DIAGNOSIS — M810 Age-related osteoporosis without current pathological fracture: Secondary | ICD-10-CM | POA: Insufficient documentation

## 2018-12-19 MED ORDER — DENOSUMAB 60 MG/ML ~~LOC~~ SOSY
60.0000 mg | PREFILLED_SYRINGE | Freq: Once | SUBCUTANEOUS | Status: AC
  Administered 2018-12-19: 60 mg via SUBCUTANEOUS

## 2018-12-19 MED ORDER — DENOSUMAB 60 MG/ML ~~LOC~~ SOSY
PREFILLED_SYRINGE | SUBCUTANEOUS | Status: AC
  Administered 2018-12-19: 60 mg via SUBCUTANEOUS
  Filled 2018-12-19: qty 1

## 2018-12-19 NOTE — Discharge Instructions (Signed)
Denosumab injection °What is this medicine? °DENOSUMAB (den oh sue mab) slows bone breakdown. Prolia is used to treat osteoporosis in women after menopause and in men, and in people who are taking corticosteroids for 6 months or more. Xgeva is used to treat a high calcium level due to cancer and to prevent bone fractures and other bone problems caused by multiple myeloma or cancer bone metastases. Xgeva is also used to treat giant cell tumor of the bone. °This medicine may be used for other purposes; ask your health care provider or pharmacist if you have questions. °COMMON BRAND NAME(S): Prolia, XGEVA °What should I tell my health care provider before I take this medicine? °They need to know if you have any of these conditions: °-dental disease °-having surgery or tooth extraction °-infection °-kidney disease °-low levels of calcium or Vitamin D in the blood °-malnutrition °-on hemodialysis °-skin conditions or sensitivity °-thyroid or parathyroid disease °-an unusual reaction to denosumab, other medicines, foods, dyes, or preservatives °-pregnant or trying to get pregnant °-breast-feeding °How should I use this medicine? °This medicine is for injection under the skin. It is given by a health care professional in a hospital or clinic setting. °A special MedGuide will be given to you before each treatment. Be sure to read this information carefully each time. °For Prolia, talk to your pediatrician regarding the use of this medicine in children. Special care may be needed. For Xgeva, talk to your pediatrician regarding the use of this medicine in children. While this drug may be prescribed for children as young as 13 years for selected conditions, precautions do apply. °Overdosage: If you think you have taken too much of this medicine contact a poison control center or emergency room at once. °NOTE: This medicine is only for you. Do not share this medicine with others. °What if I miss a dose? °It is important not to  miss your dose. Call your doctor or health care professional if you are unable to keep an appointment. °What may interact with this medicine? °Do not take this medicine with any of the following medications: °-other medicines containing denosumab °This medicine may also interact with the following medications: °-medicines that lower your chance of fighting infection °-steroid medicines like prednisone or cortisone °This list may not describe all possible interactions. Give your health care provider a list of all the medicines, herbs, non-prescription drugs, or dietary supplements you use. Also tell them if you smoke, drink alcohol, or use illegal drugs. Some items may interact with your medicine. °What should I watch for while using this medicine? °Visit your doctor or health care professional for regular checks on your progress. Your doctor or health care professional may order blood tests and other tests to see how you are doing. °Call your doctor or health care professional for advice if you get a fever, chills or sore throat, or other symptoms of a cold or flu. Do not treat yourself. This drug may decrease your body's ability to fight infection. Try to avoid being around people who are sick. °You should make sure you get enough calcium and vitamin D while you are taking this medicine, unless your doctor tells you not to. Discuss the foods you eat and the vitamins you take with your health care professional. °See your dentist regularly. Brush and floss your teeth as directed. Before you have any dental work done, tell your dentist you are receiving this medicine. °Do not become pregnant while taking this medicine or for 5 months   after stopping it. Talk with your doctor or health care professional about your birth control options while taking this medicine. Women should inform their doctor if they wish to become pregnant or think they might be pregnant. There is a potential for serious side effects to an unborn  child. Talk to your health care professional or pharmacist for more information. °What side effects may I notice from receiving this medicine? °Side effects that you should report to your doctor or health care professional as soon as possible: °-allergic reactions like skin rash, itching or hives, swelling of the face, lips, or tongue °-bone pain °-breathing problems °-dizziness °-jaw pain, especially after dental work °-redness, blistering, peeling of the skin °-signs and symptoms of infection like fever or chills; cough; sore throat; pain or trouble passing urine °-signs of low calcium like fast heartbeat, muscle cramps or muscle pain; pain, tingling, numbness in the hands or feet; seizures °-unusual bleeding or bruising °-unusually weak or tired °Side effects that usually do not require medical attention (report to your doctor or health care professional if they continue or are bothersome): °-constipation °-diarrhea °-headache °-joint pain °-loss of appetite °-muscle pain °-runny nose °-tiredness °-upset stomach °This list may not describe all possible side effects. Call your doctor for medical advice about side effects. You may report side effects to FDA at 1-800-FDA-1088. °Where should I keep my medicine? °This medicine is only given in a clinic, doctor's office, or other health care setting and will not be stored at home. °NOTE: This sheet is a summary. It may not cover all possible information. If you have questions about this medicine, talk to your doctor, pharmacist, or health care provider. °© 2019 [employer] (2018-02-08 16:10:44) ° °

## 2019-04-24 DIAGNOSIS — H353131 Nonexudative age-related macular degeneration, bilateral, early dry stage: Secondary | ICD-10-CM | POA: Diagnosis not present

## 2019-04-24 DIAGNOSIS — H2513 Age-related nuclear cataract, bilateral: Secondary | ICD-10-CM | POA: Diagnosis not present

## 2019-04-24 DIAGNOSIS — H01004 Unspecified blepharitis left upper eyelid: Secondary | ICD-10-CM | POA: Diagnosis not present

## 2019-04-24 DIAGNOSIS — H01001 Unspecified blepharitis right upper eyelid: Secondary | ICD-10-CM | POA: Diagnosis not present

## 2019-04-24 DIAGNOSIS — H01002 Unspecified blepharitis right lower eyelid: Secondary | ICD-10-CM | POA: Diagnosis not present

## 2019-04-24 DIAGNOSIS — H35373 Puckering of macula, bilateral: Secondary | ICD-10-CM | POA: Diagnosis not present

## 2019-04-25 DIAGNOSIS — D485 Neoplasm of uncertain behavior of skin: Secondary | ICD-10-CM | POA: Diagnosis not present

## 2019-04-25 DIAGNOSIS — L814 Other melanin hyperpigmentation: Secondary | ICD-10-CM | POA: Diagnosis not present

## 2019-04-25 DIAGNOSIS — Z85828 Personal history of other malignant neoplasm of skin: Secondary | ICD-10-CM | POA: Diagnosis not present

## 2019-04-25 DIAGNOSIS — L570 Actinic keratosis: Secondary | ICD-10-CM | POA: Diagnosis not present

## 2019-04-25 DIAGNOSIS — D1801 Hemangioma of skin and subcutaneous tissue: Secondary | ICD-10-CM | POA: Diagnosis not present

## 2019-04-25 DIAGNOSIS — L821 Other seborrheic keratosis: Secondary | ICD-10-CM | POA: Diagnosis not present

## 2019-05-30 DIAGNOSIS — H43811 Vitreous degeneration, right eye: Secondary | ICD-10-CM | POA: Diagnosis not present

## 2019-05-30 DIAGNOSIS — H35373 Puckering of macula, bilateral: Secondary | ICD-10-CM | POA: Diagnosis not present

## 2019-05-30 DIAGNOSIS — H34832 Tributary (branch) retinal vein occlusion, left eye, with macular edema: Secondary | ICD-10-CM | POA: Diagnosis not present

## 2019-05-30 DIAGNOSIS — H3581 Retinal edema: Secondary | ICD-10-CM | POA: Diagnosis not present

## 2019-06-10 DIAGNOSIS — Z1231 Encounter for screening mammogram for malignant neoplasm of breast: Secondary | ICD-10-CM | POA: Diagnosis not present

## 2019-06-10 DIAGNOSIS — Z803 Family history of malignant neoplasm of breast: Secondary | ICD-10-CM | POA: Diagnosis not present

## 2019-06-17 DIAGNOSIS — H2511 Age-related nuclear cataract, right eye: Secondary | ICD-10-CM | POA: Diagnosis not present

## 2019-06-24 ENCOUNTER — Other Ambulatory Visit: Payer: Self-pay

## 2019-06-24 ENCOUNTER — Ambulatory Visit (HOSPITAL_COMMUNITY)
Admission: RE | Admit: 2019-06-24 | Discharge: 2019-06-24 | Disposition: A | Discharge status: Home / Self Care | Payer: BC Managed Care – PPO | Source: Ambulatory Visit | Attending: Internal Medicine | Admitting: Internal Medicine

## 2019-06-24 DIAGNOSIS — M810 Age-related osteoporosis without current pathological fracture: Secondary | ICD-10-CM | POA: Diagnosis not present

## 2019-06-24 MED ORDER — DENOSUMAB 60 MG/ML ~~LOC~~ SOSY
PREFILLED_SYRINGE | SUBCUTANEOUS | Status: AC
  Administered 2019-06-24: 60 mg
  Filled 2019-06-24: qty 1

## 2019-06-24 MED ORDER — DENOSUMAB 60 MG/ML ~~LOC~~ SOSY: 60.0000 mg | PREFILLED_SYRINGE | Freq: Once | SUBCUTANEOUS | Status: DC

## 2019-06-30 DIAGNOSIS — H2511 Age-related nuclear cataract, right eye: Secondary | ICD-10-CM | POA: Diagnosis not present

## 2019-06-30 DIAGNOSIS — H25811 Combined forms of age-related cataract, right eye: Secondary | ICD-10-CM | POA: Diagnosis not present

## 2019-07-01 DIAGNOSIS — H2511 Age-related nuclear cataract, right eye: Secondary | ICD-10-CM | POA: Diagnosis not present

## 2019-07-08 DIAGNOSIS — H2512 Age-related nuclear cataract, left eye: Secondary | ICD-10-CM | POA: Diagnosis not present

## 2019-07-10 DIAGNOSIS — R7301 Impaired fasting glucose: Secondary | ICD-10-CM | POA: Diagnosis not present

## 2019-07-10 DIAGNOSIS — E7800 Pure hypercholesterolemia, unspecified: Secondary | ICD-10-CM | POA: Diagnosis not present

## 2019-07-10 DIAGNOSIS — M810 Age-related osteoporosis without current pathological fracture: Secondary | ICD-10-CM | POA: Diagnosis not present

## 2019-07-10 DIAGNOSIS — I10 Essential (primary) hypertension: Secondary | ICD-10-CM | POA: Diagnosis not present

## 2019-07-11 DIAGNOSIS — I10 Essential (primary) hypertension: Secondary | ICD-10-CM | POA: Diagnosis not present

## 2019-07-11 DIAGNOSIS — Z23 Encounter for immunization: Secondary | ICD-10-CM | POA: Diagnosis not present

## 2019-07-11 DIAGNOSIS — R82998 Other abnormal findings in urine: Secondary | ICD-10-CM | POA: Diagnosis not present

## 2019-07-17 DIAGNOSIS — E7800 Pure hypercholesterolemia, unspecified: Secondary | ICD-10-CM | POA: Diagnosis not present

## 2019-07-17 DIAGNOSIS — R7301 Impaired fasting glucose: Secondary | ICD-10-CM | POA: Diagnosis not present

## 2019-07-17 DIAGNOSIS — M810 Age-related osteoporosis without current pathological fracture: Secondary | ICD-10-CM | POA: Diagnosis not present

## 2019-07-17 DIAGNOSIS — I10 Essential (primary) hypertension: Secondary | ICD-10-CM | POA: Diagnosis not present

## 2019-07-17 DIAGNOSIS — Z0000 Encounter for general adult medical examination without abnormal findings: Secondary | ICD-10-CM | POA: Diagnosis not present

## 2019-07-17 DIAGNOSIS — Z1331 Encounter for screening for depression: Secondary | ICD-10-CM | POA: Diagnosis not present

## 2019-07-21 DIAGNOSIS — H25812 Combined forms of age-related cataract, left eye: Secondary | ICD-10-CM | POA: Diagnosis not present

## 2019-07-21 DIAGNOSIS — H2512 Age-related nuclear cataract, left eye: Secondary | ICD-10-CM | POA: Diagnosis not present

## 2019-07-25 DIAGNOSIS — Z1212 Encounter for screening for malignant neoplasm of rectum: Secondary | ICD-10-CM | POA: Diagnosis not present

## 2019-08-13 DIAGNOSIS — H43811 Vitreous degeneration, right eye: Secondary | ICD-10-CM | POA: Diagnosis not present

## 2019-08-13 DIAGNOSIS — H35373 Puckering of macula, bilateral: Secondary | ICD-10-CM | POA: Diagnosis not present

## 2019-08-13 DIAGNOSIS — H3581 Retinal edema: Secondary | ICD-10-CM | POA: Diagnosis not present

## 2019-08-13 DIAGNOSIS — H34832 Tributary (branch) retinal vein occlusion, left eye, with macular edema: Secondary | ICD-10-CM | POA: Diagnosis not present

## 2019-09-04 DIAGNOSIS — H35371 Puckering of macula, right eye: Secondary | ICD-10-CM | POA: Diagnosis not present

## 2019-09-04 DIAGNOSIS — H35372 Puckering of macula, left eye: Secondary | ICD-10-CM | POA: Diagnosis not present

## 2019-09-04 DIAGNOSIS — H3581 Retinal edema: Secondary | ICD-10-CM | POA: Diagnosis not present

## 2019-09-04 DIAGNOSIS — H26491 Other secondary cataract, right eye: Secondary | ICD-10-CM | POA: Diagnosis not present

## 2019-09-05 DIAGNOSIS — H35371 Puckering of macula, right eye: Secondary | ICD-10-CM | POA: Diagnosis not present

## 2019-09-09 DIAGNOSIS — M25561 Pain in right knee: Secondary | ICD-10-CM | POA: Diagnosis not present

## 2019-09-10 DIAGNOSIS — H35372 Puckering of macula, left eye: Secondary | ICD-10-CM | POA: Diagnosis not present

## 2019-09-10 DIAGNOSIS — H3581 Retinal edema: Secondary | ICD-10-CM | POA: Diagnosis not present

## 2019-10-01 DIAGNOSIS — H35371 Puckering of macula, right eye: Secondary | ICD-10-CM | POA: Diagnosis not present

## 2019-10-01 DIAGNOSIS — H3581 Retinal edema: Secondary | ICD-10-CM | POA: Diagnosis not present

## 2019-10-24 DIAGNOSIS — F419 Anxiety disorder, unspecified: Secondary | ICD-10-CM | POA: Diagnosis not present

## 2019-10-24 DIAGNOSIS — R197 Diarrhea, unspecified: Secondary | ICD-10-CM | POA: Diagnosis not present

## 2019-10-27 DIAGNOSIS — Z23 Encounter for immunization: Secondary | ICD-10-CM | POA: Diagnosis not present

## 2019-11-03 DIAGNOSIS — D1801 Hemangioma of skin and subcutaneous tissue: Secondary | ICD-10-CM | POA: Diagnosis not present

## 2019-11-03 DIAGNOSIS — Z85828 Personal history of other malignant neoplasm of skin: Secondary | ICD-10-CM | POA: Diagnosis not present

## 2019-11-03 DIAGNOSIS — L814 Other melanin hyperpigmentation: Secondary | ICD-10-CM | POA: Diagnosis not present

## 2019-11-03 DIAGNOSIS — L570 Actinic keratosis: Secondary | ICD-10-CM | POA: Diagnosis not present

## 2019-11-03 DIAGNOSIS — L821 Other seborrheic keratosis: Secondary | ICD-10-CM | POA: Diagnosis not present

## 2019-11-03 DIAGNOSIS — C44722 Squamous cell carcinoma of skin of right lower limb, including hip: Secondary | ICD-10-CM | POA: Diagnosis not present

## 2019-11-03 DIAGNOSIS — C44729 Squamous cell carcinoma of skin of left lower limb, including hip: Secondary | ICD-10-CM | POA: Diagnosis not present

## 2019-11-13 ENCOUNTER — Ambulatory Visit: Payer: BC Managed Care – PPO

## 2019-11-17 DIAGNOSIS — Z23 Encounter for immunization: Secondary | ICD-10-CM | POA: Diagnosis not present

## 2019-11-20 DIAGNOSIS — F419 Anxiety disorder, unspecified: Secondary | ICD-10-CM | POA: Diagnosis not present

## 2019-11-20 DIAGNOSIS — R197 Diarrhea, unspecified: Secondary | ICD-10-CM | POA: Diagnosis not present

## 2020-02-06 DIAGNOSIS — H3581 Retinal edema: Secondary | ICD-10-CM | POA: Diagnosis not present

## 2020-02-06 DIAGNOSIS — H35371 Puckering of macula, right eye: Secondary | ICD-10-CM | POA: Diagnosis not present

## 2020-02-11 DIAGNOSIS — H3581 Retinal edema: Secondary | ICD-10-CM | POA: Diagnosis not present

## 2020-02-12 ENCOUNTER — Other Ambulatory Visit (HOSPITAL_COMMUNITY): Payer: Self-pay | Admitting: *Deleted

## 2020-02-13 ENCOUNTER — Other Ambulatory Visit: Payer: Self-pay

## 2020-02-13 ENCOUNTER — Ambulatory Visit (HOSPITAL_COMMUNITY)
Admission: RE | Admit: 2020-02-13 | Discharge: 2020-02-13 | Disposition: A | Discharge status: Home / Self Care | Payer: BLUE CROSS/BLUE SHIELD | Source: Ambulatory Visit | Attending: Internal Medicine | Admitting: Internal Medicine

## 2020-02-13 DIAGNOSIS — M810 Age-related osteoporosis without current pathological fracture: Secondary | ICD-10-CM | POA: Diagnosis not present

## 2020-02-13 MED ORDER — DENOSUMAB 60 MG/ML ~~LOC~~ SOSY: 60.0000 mg | PREFILLED_SYRINGE | Freq: Once | SUBCUTANEOUS | Status: AC

## 2020-02-13 MED ORDER — DENOSUMAB 60 MG/ML ~~LOC~~ SOSY
PREFILLED_SYRINGE | SUBCUTANEOUS | Status: AC
  Administered 2020-02-13: 60 mg via SUBCUTANEOUS
  Filled 2020-02-13: qty 1

## 2020-02-27 ENCOUNTER — Encounter (HOSPITAL_COMMUNITY): Payer: BLUE CROSS/BLUE SHIELD

## 2020-03-10 DIAGNOSIS — H3581 Retinal edema: Secondary | ICD-10-CM | POA: Diagnosis not present

## 2020-03-10 DIAGNOSIS — H34832 Tributary (branch) retinal vein occlusion, left eye, with macular edema: Secondary | ICD-10-CM | POA: Diagnosis not present

## 2020-03-10 DIAGNOSIS — H35372 Puckering of macula, left eye: Secondary | ICD-10-CM | POA: Diagnosis not present

## 2020-03-10 DIAGNOSIS — L820 Inflamed seborrheic keratosis: Secondary | ICD-10-CM | POA: Diagnosis not present

## 2020-05-05 DIAGNOSIS — L821 Other seborrheic keratosis: Secondary | ICD-10-CM | POA: Diagnosis not present

## 2020-05-05 DIAGNOSIS — D692 Other nonthrombocytopenic purpura: Secondary | ICD-10-CM | POA: Diagnosis not present

## 2020-05-05 DIAGNOSIS — L814 Other melanin hyperpigmentation: Secondary | ICD-10-CM | POA: Diagnosis not present

## 2020-05-05 DIAGNOSIS — L570 Actinic keratosis: Secondary | ICD-10-CM | POA: Diagnosis not present

## 2020-05-05 DIAGNOSIS — C44519 Basal cell carcinoma of skin of other part of trunk: Secondary | ICD-10-CM | POA: Diagnosis not present

## 2020-05-05 DIAGNOSIS — Z85828 Personal history of other malignant neoplasm of skin: Secondary | ICD-10-CM | POA: Diagnosis not present

## 2020-06-09 DIAGNOSIS — H34831 Tributary (branch) retinal vein occlusion, right eye, with macular edema: Secondary | ICD-10-CM | POA: Diagnosis not present

## 2020-06-09 DIAGNOSIS — H35372 Puckering of macula, left eye: Secondary | ICD-10-CM | POA: Diagnosis not present

## 2020-06-09 DIAGNOSIS — H348322 Tributary (branch) retinal vein occlusion, left eye, stable: Secondary | ICD-10-CM | POA: Diagnosis not present

## 2020-06-09 DIAGNOSIS — H35362 Drusen (degenerative) of macula, left eye: Secondary | ICD-10-CM | POA: Diagnosis not present

## 2020-06-15 DIAGNOSIS — M545 Low back pain: Secondary | ICD-10-CM | POA: Diagnosis not present

## 2020-06-24 DIAGNOSIS — Z1231 Encounter for screening mammogram for malignant neoplasm of breast: Secondary | ICD-10-CM | POA: Diagnosis not present

## 2020-06-25 DIAGNOSIS — Z961 Presence of intraocular lens: Secondary | ICD-10-CM | POA: Diagnosis not present

## 2020-06-25 DIAGNOSIS — H353131 Nonexudative age-related macular degeneration, bilateral, early dry stage: Secondary | ICD-10-CM | POA: Diagnosis not present

## 2020-06-25 DIAGNOSIS — H35373 Puckering of macula, bilateral: Secondary | ICD-10-CM | POA: Diagnosis not present

## 2020-06-25 DIAGNOSIS — H40053 Ocular hypertension, bilateral: Secondary | ICD-10-CM | POA: Diagnosis not present

## 2020-07-02 DIAGNOSIS — H40053 Ocular hypertension, bilateral: Secondary | ICD-10-CM | POA: Diagnosis not present

## 2020-07-02 DIAGNOSIS — H3581 Retinal edema: Secondary | ICD-10-CM | POA: Diagnosis not present

## 2020-07-14 DIAGNOSIS — H34831 Tributary (branch) retinal vein occlusion, right eye, with macular edema: Secondary | ICD-10-CM | POA: Diagnosis not present

## 2020-07-20 DIAGNOSIS — E7800 Pure hypercholesterolemia, unspecified: Secondary | ICD-10-CM | POA: Diagnosis not present

## 2020-07-20 DIAGNOSIS — Z0000 Encounter for general adult medical examination without abnormal findings: Secondary | ICD-10-CM | POA: Diagnosis not present

## 2020-07-20 DIAGNOSIS — M810 Age-related osteoporosis without current pathological fracture: Secondary | ICD-10-CM | POA: Diagnosis not present

## 2020-07-27 DIAGNOSIS — Z1331 Encounter for screening for depression: Secondary | ICD-10-CM | POA: Diagnosis not present

## 2020-07-27 DIAGNOSIS — Z1339 Encounter for screening examination for other mental health and behavioral disorders: Secondary | ICD-10-CM | POA: Diagnosis not present

## 2020-07-27 DIAGNOSIS — R7301 Impaired fasting glucose: Secondary | ICD-10-CM | POA: Diagnosis not present

## 2020-07-27 DIAGNOSIS — Z23 Encounter for immunization: Secondary | ICD-10-CM | POA: Diagnosis not present

## 2020-07-27 DIAGNOSIS — Z0000 Encounter for general adult medical examination without abnormal findings: Secondary | ICD-10-CM | POA: Diagnosis not present

## 2020-07-27 DIAGNOSIS — I10 Essential (primary) hypertension: Secondary | ICD-10-CM | POA: Diagnosis not present

## 2020-08-09 DIAGNOSIS — Z1212 Encounter for screening for malignant neoplasm of rectum: Secondary | ICD-10-CM | POA: Diagnosis not present

## 2020-08-11 DIAGNOSIS — H34831 Tributary (branch) retinal vein occlusion, right eye, with macular edema: Secondary | ICD-10-CM | POA: Diagnosis not present

## 2020-08-11 DIAGNOSIS — H348322 Tributary (branch) retinal vein occlusion, left eye, stable: Secondary | ICD-10-CM | POA: Diagnosis not present

## 2020-08-11 DIAGNOSIS — H35372 Puckering of macula, left eye: Secondary | ICD-10-CM | POA: Diagnosis not present

## 2020-08-11 DIAGNOSIS — H35362 Drusen (degenerative) of macula, left eye: Secondary | ICD-10-CM | POA: Diagnosis not present

## 2020-08-18 DIAGNOSIS — H43812 Vitreous degeneration, left eye: Secondary | ICD-10-CM | POA: Diagnosis not present

## 2020-08-18 DIAGNOSIS — K219 Gastro-esophageal reflux disease without esophagitis: Secondary | ICD-10-CM | POA: Diagnosis not present

## 2020-08-18 DIAGNOSIS — H348322 Tributary (branch) retinal vein occlusion, left eye, stable: Secondary | ICD-10-CM | POA: Diagnosis not present

## 2020-08-18 DIAGNOSIS — H34831 Tributary (branch) retinal vein occlusion, right eye, with macular edema: Secondary | ICD-10-CM | POA: Diagnosis not present

## 2020-08-20 ENCOUNTER — Other Ambulatory Visit (HOSPITAL_COMMUNITY): Payer: Self-pay | Admitting: *Deleted

## 2020-08-24 ENCOUNTER — Other Ambulatory Visit: Payer: Self-pay

## 2020-08-24 ENCOUNTER — Ambulatory Visit (HOSPITAL_COMMUNITY)
Admission: RE | Admit: 2020-08-24 | Discharge: 2020-08-24 | Disposition: A | Discharge status: Home / Self Care | Payer: BLUE CROSS/BLUE SHIELD | Source: Ambulatory Visit | Attending: Internal Medicine | Admitting: Internal Medicine

## 2020-08-24 DIAGNOSIS — M810 Age-related osteoporosis without current pathological fracture: Secondary | ICD-10-CM | POA: Diagnosis not present

## 2020-08-24 MED ORDER — DENOSUMAB 60 MG/ML ~~LOC~~ SOSY
60.0000 mg | PREFILLED_SYRINGE | Freq: Once | SUBCUTANEOUS | Status: AC
  Administered 2020-08-24: 60 mg via SUBCUTANEOUS

## 2020-08-24 MED ORDER — DENOSUMAB 60 MG/ML ~~LOC~~ SOSY
PREFILLED_SYRINGE | SUBCUTANEOUS | Status: AC
  Filled 2020-08-24: qty 1

## 2020-09-17 DIAGNOSIS — H43812 Vitreous degeneration, left eye: Secondary | ICD-10-CM | POA: Diagnosis not present

## 2020-09-17 DIAGNOSIS — H348322 Tributary (branch) retinal vein occlusion, left eye, stable: Secondary | ICD-10-CM | POA: Diagnosis not present

## 2020-09-17 DIAGNOSIS — H35372 Puckering of macula, left eye: Secondary | ICD-10-CM | POA: Diagnosis not present

## 2020-09-17 DIAGNOSIS — H34831 Tributary (branch) retinal vein occlusion, right eye, with macular edema: Secondary | ICD-10-CM | POA: Diagnosis not present

## 2020-10-14 DIAGNOSIS — Z1159 Encounter for screening for other viral diseases: Secondary | ICD-10-CM | POA: Diagnosis not present

## 2020-10-19 DIAGNOSIS — H35362 Drusen (degenerative) of macula, left eye: Secondary | ICD-10-CM | POA: Diagnosis not present

## 2020-10-19 DIAGNOSIS — H35372 Puckering of macula, left eye: Secondary | ICD-10-CM | POA: Diagnosis not present

## 2020-10-19 DIAGNOSIS — H34833 Tributary (branch) retinal vein occlusion, bilateral, with macular edema: Secondary | ICD-10-CM | POA: Diagnosis not present

## 2020-10-19 DIAGNOSIS — H43812 Vitreous degeneration, left eye: Secondary | ICD-10-CM | POA: Diagnosis not present

## 2020-11-22 DIAGNOSIS — H34831 Tributary (branch) retinal vein occlusion, right eye, with macular edema: Secondary | ICD-10-CM | POA: Diagnosis not present

## 2020-11-22 DIAGNOSIS — H43812 Vitreous degeneration, left eye: Secondary | ICD-10-CM | POA: Diagnosis not present

## 2020-12-29 DIAGNOSIS — H34831 Tributary (branch) retinal vein occlusion, right eye, with macular edema: Secondary | ICD-10-CM | POA: Diagnosis not present

## 2021-02-04 ENCOUNTER — Encounter (HOSPITAL_COMMUNITY): Payer: BLUE CROSS/BLUE SHIELD

## 2021-02-04 DIAGNOSIS — H35362 Drusen (degenerative) of macula, left eye: Secondary | ICD-10-CM | POA: Diagnosis not present

## 2021-02-04 DIAGNOSIS — H43812 Vitreous degeneration, left eye: Secondary | ICD-10-CM | POA: Diagnosis not present

## 2021-02-04 DIAGNOSIS — H35372 Puckering of macula, left eye: Secondary | ICD-10-CM | POA: Diagnosis not present

## 2021-02-04 DIAGNOSIS — H34833 Tributary (branch) retinal vein occlusion, bilateral, with macular edema: Secondary | ICD-10-CM | POA: Diagnosis not present

## 2021-02-08 ENCOUNTER — Other Ambulatory Visit (HOSPITAL_COMMUNITY): Payer: Self-pay | Admitting: *Deleted

## 2021-02-09 ENCOUNTER — Other Ambulatory Visit: Payer: Self-pay

## 2021-02-09 ENCOUNTER — Ambulatory Visit (HOSPITAL_COMMUNITY)
Admission: RE | Admit: 2021-02-09 | Discharge: 2021-02-09 | Disposition: A | Discharge status: Home / Self Care | Payer: BLUE CROSS/BLUE SHIELD | Source: Ambulatory Visit | Attending: Internal Medicine | Admitting: Internal Medicine

## 2021-02-09 DIAGNOSIS — M818 Other osteoporosis without current pathological fracture: Secondary | ICD-10-CM | POA: Insufficient documentation

## 2021-02-09 MED ORDER — DENOSUMAB 60 MG/ML ~~LOC~~ SOSY: 60.0000 mg | PREFILLED_SYRINGE | Freq: Once | SUBCUTANEOUS | Status: AC

## 2021-02-09 MED ORDER — DENOSUMAB 60 MG/ML ~~LOC~~ SOSY
PREFILLED_SYRINGE | SUBCUTANEOUS | Status: AC
  Administered 2021-02-09: 60 mg via SUBCUTANEOUS
  Filled 2021-02-09: qty 1

## 2021-03-08 DIAGNOSIS — H35362 Drusen (degenerative) of macula, left eye: Secondary | ICD-10-CM | POA: Diagnosis not present

## 2021-03-08 DIAGNOSIS — H43812 Vitreous degeneration, left eye: Secondary | ICD-10-CM | POA: Diagnosis not present

## 2021-03-08 DIAGNOSIS — H34833 Tributary (branch) retinal vein occlusion, bilateral, with macular edema: Secondary | ICD-10-CM | POA: Diagnosis not present

## 2021-03-08 DIAGNOSIS — H34831 Tributary (branch) retinal vein occlusion, right eye, with macular edema: Secondary | ICD-10-CM | POA: Diagnosis not present

## 2021-03-08 DIAGNOSIS — H35372 Puckering of macula, left eye: Secondary | ICD-10-CM | POA: Diagnosis not present

## 2021-03-23 ENCOUNTER — Other Ambulatory Visit: Payer: Self-pay

## 2021-03-23 DIAGNOSIS — Z1159 Encounter for screening for other viral diseases: Secondary | ICD-10-CM | POA: Diagnosis not present

## 2021-03-24 DIAGNOSIS — Z20822 Contact with and (suspected) exposure to covid-19: Secondary | ICD-10-CM | POA: Diagnosis not present

## 2021-03-24 LAB — SARS CORONAVIRUS 2 (TAT 6-24 HRS): SARS Coronavirus 2: POSITIVE — AB

## 2021-04-08 DIAGNOSIS — H35372 Puckering of macula, left eye: Secondary | ICD-10-CM | POA: Diagnosis not present

## 2021-04-08 DIAGNOSIS — H34833 Tributary (branch) retinal vein occlusion, bilateral, with macular edema: Secondary | ICD-10-CM | POA: Diagnosis not present

## 2021-04-08 DIAGNOSIS — H43812 Vitreous degeneration, left eye: Secondary | ICD-10-CM | POA: Diagnosis not present

## 2021-04-08 DIAGNOSIS — H35362 Drusen (degenerative) of macula, left eye: Secondary | ICD-10-CM | POA: Diagnosis not present

## 2021-04-22 DIAGNOSIS — H34831 Tributary (branch) retinal vein occlusion, right eye, with macular edema: Secondary | ICD-10-CM | POA: Diagnosis not present

## 2021-05-10 DIAGNOSIS — H348312 Tributary (branch) retinal vein occlusion, right eye, stable: Secondary | ICD-10-CM | POA: Diagnosis not present

## 2021-05-10 DIAGNOSIS — H34832 Tributary (branch) retinal vein occlusion, left eye, with macular edema: Secondary | ICD-10-CM | POA: Diagnosis not present

## 2021-05-10 DIAGNOSIS — H43812 Vitreous degeneration, left eye: Secondary | ICD-10-CM | POA: Diagnosis not present

## 2021-06-01 DIAGNOSIS — H43812 Vitreous degeneration, left eye: Secondary | ICD-10-CM | POA: Diagnosis not present

## 2021-06-01 DIAGNOSIS — H34832 Tributary (branch) retinal vein occlusion, left eye, with macular edema: Secondary | ICD-10-CM | POA: Diagnosis not present

## 2021-06-01 DIAGNOSIS — H26492 Other secondary cataract, left eye: Secondary | ICD-10-CM | POA: Diagnosis not present

## 2021-06-01 DIAGNOSIS — H348312 Tributary (branch) retinal vein occlusion, right eye, stable: Secondary | ICD-10-CM | POA: Diagnosis not present

## 2021-06-29 DIAGNOSIS — H35373 Puckering of macula, bilateral: Secondary | ICD-10-CM | POA: Diagnosis not present

## 2021-06-29 DIAGNOSIS — H34831 Tributary (branch) retinal vein occlusion, right eye, with macular edema: Secondary | ICD-10-CM | POA: Diagnosis not present

## 2021-06-29 DIAGNOSIS — H348322 Tributary (branch) retinal vein occlusion, left eye, stable: Secondary | ICD-10-CM | POA: Diagnosis not present

## 2021-06-30 DIAGNOSIS — Z1231 Encounter for screening mammogram for malignant neoplasm of breast: Secondary | ICD-10-CM | POA: Diagnosis not present

## 2021-07-26 DIAGNOSIS — M25562 Pain in left knee: Secondary | ICD-10-CM | POA: Diagnosis not present

## 2021-07-26 DIAGNOSIS — M25561 Pain in right knee: Secondary | ICD-10-CM | POA: Diagnosis not present

## 2021-08-22 DIAGNOSIS — H348322 Tributary (branch) retinal vein occlusion, left eye, stable: Secondary | ICD-10-CM | POA: Diagnosis not present

## 2021-08-22 DIAGNOSIS — H353131 Nonexudative age-related macular degeneration, bilateral, early dry stage: Secondary | ICD-10-CM | POA: Diagnosis not present

## 2021-08-22 DIAGNOSIS — H34831 Tributary (branch) retinal vein occlusion, right eye, with macular edema: Secondary | ICD-10-CM | POA: Diagnosis not present

## 2021-08-22 DIAGNOSIS — Z961 Presence of intraocular lens: Secondary | ICD-10-CM | POA: Diagnosis not present

## 2021-08-23 DIAGNOSIS — M810 Age-related osteoporosis without current pathological fracture: Secondary | ICD-10-CM | POA: Diagnosis not present

## 2021-08-23 DIAGNOSIS — I10 Essential (primary) hypertension: Secondary | ICD-10-CM | POA: Diagnosis not present

## 2021-08-23 DIAGNOSIS — R7301 Impaired fasting glucose: Secondary | ICD-10-CM | POA: Diagnosis not present

## 2021-08-29 DIAGNOSIS — Z1339 Encounter for screening examination for other mental health and behavioral disorders: Secondary | ICD-10-CM | POA: Diagnosis not present

## 2021-08-29 DIAGNOSIS — Z1331 Encounter for screening for depression: Secondary | ICD-10-CM | POA: Diagnosis not present

## 2021-08-29 DIAGNOSIS — Z0000 Encounter for general adult medical examination without abnormal findings: Secondary | ICD-10-CM | POA: Diagnosis not present

## 2021-08-29 DIAGNOSIS — I10 Essential (primary) hypertension: Secondary | ICD-10-CM | POA: Diagnosis not present

## 2021-08-29 DIAGNOSIS — B349 Viral infection, unspecified: Secondary | ICD-10-CM | POA: Diagnosis not present

## 2021-09-14 DIAGNOSIS — H34831 Tributary (branch) retinal vein occlusion, right eye, with macular edema: Secondary | ICD-10-CM | POA: Diagnosis not present

## 2021-09-14 DIAGNOSIS — H43812 Vitreous degeneration, left eye: Secondary | ICD-10-CM | POA: Diagnosis not present

## 2021-09-14 DIAGNOSIS — H348322 Tributary (branch) retinal vein occlusion, left eye, stable: Secondary | ICD-10-CM | POA: Diagnosis not present

## 2021-09-15 ENCOUNTER — Other Ambulatory Visit (HOSPITAL_COMMUNITY): Payer: Self-pay

## 2021-09-16 ENCOUNTER — Other Ambulatory Visit: Payer: Self-pay

## 2021-09-16 ENCOUNTER — Ambulatory Visit (HOSPITAL_COMMUNITY)
Admission: RE | Admit: 2021-09-16 | Discharge: 2021-09-16 | Disposition: A | Discharge status: Home / Self Care | Payer: BC Managed Care – PPO | Source: Ambulatory Visit | Attending: Internal Medicine | Admitting: Internal Medicine

## 2021-09-16 DIAGNOSIS — M810 Age-related osteoporosis without current pathological fracture: Secondary | ICD-10-CM | POA: Insufficient documentation

## 2021-09-16 MED ORDER — DENOSUMAB 60 MG/ML ~~LOC~~ SOSY: 60.0000 mg | PREFILLED_SYRINGE | Freq: Once | SUBCUTANEOUS | Status: AC

## 2021-09-16 MED ORDER — DENOSUMAB 60 MG/ML ~~LOC~~ SOSY
PREFILLED_SYRINGE | SUBCUTANEOUS | Status: AC
  Administered 2021-09-16: 60 mg via SUBCUTANEOUS
  Filled 2021-09-16: qty 1

## 2021-09-19 DIAGNOSIS — M810 Age-related osteoporosis without current pathological fracture: Secondary | ICD-10-CM | POA: Diagnosis not present

## 2021-11-15 DIAGNOSIS — H348322 Tributary (branch) retinal vein occlusion, left eye, stable: Secondary | ICD-10-CM | POA: Diagnosis not present

## 2021-11-15 DIAGNOSIS — H34831 Tributary (branch) retinal vein occlusion, right eye, with macular edema: Secondary | ICD-10-CM | POA: Diagnosis not present

## 2021-11-15 DIAGNOSIS — H43812 Vitreous degeneration, left eye: Secondary | ICD-10-CM | POA: Diagnosis not present

## 2021-11-17 DIAGNOSIS — L570 Actinic keratosis: Secondary | ICD-10-CM | POA: Diagnosis not present

## 2021-11-17 DIAGNOSIS — C44619 Basal cell carcinoma of skin of left upper limb, including shoulder: Secondary | ICD-10-CM | POA: Diagnosis not present

## 2021-11-17 DIAGNOSIS — Z85828 Personal history of other malignant neoplasm of skin: Secondary | ICD-10-CM | POA: Diagnosis not present

## 2021-11-17 DIAGNOSIS — D225 Melanocytic nevi of trunk: Secondary | ICD-10-CM | POA: Diagnosis not present

## 2021-11-17 DIAGNOSIS — L821 Other seborrheic keratosis: Secondary | ICD-10-CM | POA: Diagnosis not present

## 2021-11-17 DIAGNOSIS — L814 Other melanin hyperpigmentation: Secondary | ICD-10-CM | POA: Diagnosis not present

## 2021-12-13 DIAGNOSIS — H43812 Vitreous degeneration, left eye: Secondary | ICD-10-CM | POA: Diagnosis not present

## 2021-12-13 DIAGNOSIS — H34833 Tributary (branch) retinal vein occlusion, bilateral, with macular edema: Secondary | ICD-10-CM | POA: Diagnosis not present

## 2022-01-13 DIAGNOSIS — H34833 Tributary (branch) retinal vein occlusion, bilateral, with macular edema: Secondary | ICD-10-CM | POA: Diagnosis not present

## 2022-01-13 DIAGNOSIS — H43812 Vitreous degeneration, left eye: Secondary | ICD-10-CM | POA: Diagnosis not present

## 2022-01-13 DIAGNOSIS — H43392 Other vitreous opacities, left eye: Secondary | ICD-10-CM | POA: Diagnosis not present

## 2022-02-02 DIAGNOSIS — M25562 Pain in left knee: Secondary | ICD-10-CM | POA: Diagnosis not present

## 2022-02-02 DIAGNOSIS — M25561 Pain in right knee: Secondary | ICD-10-CM | POA: Diagnosis not present

## 2022-02-10 DIAGNOSIS — H43392 Other vitreous opacities, left eye: Secondary | ICD-10-CM | POA: Diagnosis not present

## 2022-02-10 DIAGNOSIS — H26492 Other secondary cataract, left eye: Secondary | ICD-10-CM | POA: Diagnosis not present

## 2022-02-10 DIAGNOSIS — H35362 Drusen (degenerative) of macula, left eye: Secondary | ICD-10-CM | POA: Diagnosis not present

## 2022-02-10 DIAGNOSIS — H34831 Tributary (branch) retinal vein occlusion, right eye, with macular edema: Secondary | ICD-10-CM | POA: Diagnosis not present

## 2022-03-16 DIAGNOSIS — H34831 Tributary (branch) retinal vein occlusion, right eye, with macular edema: Secondary | ICD-10-CM | POA: Diagnosis not present

## 2022-03-16 DIAGNOSIS — H35362 Drusen (degenerative) of macula, left eye: Secondary | ICD-10-CM | POA: Diagnosis not present

## 2022-03-16 DIAGNOSIS — H34833 Tributary (branch) retinal vein occlusion, bilateral, with macular edema: Secondary | ICD-10-CM | POA: Diagnosis not present

## 2022-03-28 ENCOUNTER — Encounter (HOSPITAL_COMMUNITY): Payer: BC Managed Care – PPO

## 2022-04-12 ENCOUNTER — Other Ambulatory Visit (HOSPITAL_COMMUNITY): Payer: Self-pay | Admitting: *Deleted

## 2022-04-13 ENCOUNTER — Ambulatory Visit (HOSPITAL_COMMUNITY)
Admission: RE | Admit: 2022-04-13 | Discharge: 2022-04-13 | Disposition: A | Discharge status: Home / Self Care | Payer: BC Managed Care – PPO | Source: Ambulatory Visit | Attending: Internal Medicine | Admitting: Internal Medicine

## 2022-04-13 DIAGNOSIS — L814 Other melanin hyperpigmentation: Secondary | ICD-10-CM | POA: Diagnosis not present

## 2022-04-13 DIAGNOSIS — L820 Inflamed seborrheic keratosis: Secondary | ICD-10-CM | POA: Diagnosis not present

## 2022-04-13 DIAGNOSIS — M810 Age-related osteoporosis without current pathological fracture: Secondary | ICD-10-CM | POA: Insufficient documentation

## 2022-04-13 DIAGNOSIS — Z85828 Personal history of other malignant neoplasm of skin: Secondary | ICD-10-CM | POA: Diagnosis not present

## 2022-04-13 DIAGNOSIS — L570 Actinic keratosis: Secondary | ICD-10-CM | POA: Diagnosis not present

## 2022-04-13 MED ORDER — DENOSUMAB 60 MG/ML ~~LOC~~ SOSY
60.0000 mg | PREFILLED_SYRINGE | Freq: Once | SUBCUTANEOUS | Status: AC
  Administered 2022-04-13: 60 mg via SUBCUTANEOUS

## 2022-04-13 MED ORDER — DENOSUMAB 60 MG/ML ~~LOC~~ SOSY
PREFILLED_SYRINGE | SUBCUTANEOUS | Status: AC
  Filled 2022-04-13: qty 1

## 2022-04-14 DIAGNOSIS — H34831 Tributary (branch) retinal vein occlusion, right eye, with macular edema: Secondary | ICD-10-CM | POA: Diagnosis not present

## 2022-04-14 DIAGNOSIS — H43392 Other vitreous opacities, left eye: Secondary | ICD-10-CM | POA: Diagnosis not present

## 2022-04-14 DIAGNOSIS — H348322 Tributary (branch) retinal vein occlusion, left eye, stable: Secondary | ICD-10-CM | POA: Diagnosis not present

## 2022-04-14 DIAGNOSIS — H35362 Drusen (degenerative) of macula, left eye: Secondary | ICD-10-CM | POA: Diagnosis not present

## 2022-05-16 DIAGNOSIS — H348322 Tributary (branch) retinal vein occlusion, left eye, stable: Secondary | ICD-10-CM | POA: Diagnosis not present

## 2022-05-16 DIAGNOSIS — H34831 Tributary (branch) retinal vein occlusion, right eye, with macular edema: Secondary | ICD-10-CM | POA: Diagnosis not present

## 2022-05-16 DIAGNOSIS — H43812 Vitreous degeneration, left eye: Secondary | ICD-10-CM | POA: Diagnosis not present

## 2022-05-16 DIAGNOSIS — H35362 Drusen (degenerative) of macula, left eye: Secondary | ICD-10-CM | POA: Diagnosis not present

## 2022-06-13 DIAGNOSIS — H34833 Tributary (branch) retinal vein occlusion, bilateral, with macular edema: Secondary | ICD-10-CM | POA: Diagnosis not present

## 2022-06-13 DIAGNOSIS — H34831 Tributary (branch) retinal vein occlusion, right eye, with macular edema: Secondary | ICD-10-CM | POA: Diagnosis not present

## 2022-06-13 DIAGNOSIS — H43812 Vitreous degeneration, left eye: Secondary | ICD-10-CM | POA: Diagnosis not present

## 2022-06-13 DIAGNOSIS — H35362 Drusen (degenerative) of macula, left eye: Secondary | ICD-10-CM | POA: Diagnosis not present

## 2022-06-13 DIAGNOSIS — H43392 Other vitreous opacities, left eye: Secondary | ICD-10-CM | POA: Diagnosis not present

## 2022-07-17 DIAGNOSIS — Z1231 Encounter for screening mammogram for malignant neoplasm of breast: Secondary | ICD-10-CM | POA: Diagnosis not present

## 2022-07-18 DIAGNOSIS — H43812 Vitreous degeneration, left eye: Secondary | ICD-10-CM | POA: Diagnosis not present

## 2022-07-18 DIAGNOSIS — H34833 Tributary (branch) retinal vein occlusion, bilateral, with macular edema: Secondary | ICD-10-CM | POA: Diagnosis not present

## 2022-07-18 DIAGNOSIS — H35362 Drusen (degenerative) of macula, left eye: Secondary | ICD-10-CM | POA: Diagnosis not present

## 2022-07-18 DIAGNOSIS — H43392 Other vitreous opacities, left eye: Secondary | ICD-10-CM | POA: Diagnosis not present

## 2022-07-18 DIAGNOSIS — H34831 Tributary (branch) retinal vein occlusion, right eye, with macular edema: Secondary | ICD-10-CM | POA: Diagnosis not present

## 2022-08-21 DIAGNOSIS — M25562 Pain in left knee: Secondary | ICD-10-CM | POA: Diagnosis not present

## 2022-08-21 DIAGNOSIS — M25561 Pain in right knee: Secondary | ICD-10-CM | POA: Diagnosis not present

## 2022-08-22 DIAGNOSIS — H43392 Other vitreous opacities, left eye: Secondary | ICD-10-CM | POA: Diagnosis not present

## 2022-08-22 DIAGNOSIS — H348322 Tributary (branch) retinal vein occlusion, left eye, stable: Secondary | ICD-10-CM | POA: Diagnosis not present

## 2022-08-22 DIAGNOSIS — H35362 Drusen (degenerative) of macula, left eye: Secondary | ICD-10-CM | POA: Diagnosis not present

## 2022-08-22 DIAGNOSIS — H43812 Vitreous degeneration, left eye: Secondary | ICD-10-CM | POA: Diagnosis not present

## 2022-08-22 DIAGNOSIS — H34831 Tributary (branch) retinal vein occlusion, right eye, with macular edema: Secondary | ICD-10-CM | POA: Diagnosis not present

## 2022-08-23 ENCOUNTER — Other Ambulatory Visit (HOSPITAL_COMMUNITY): Payer: Self-pay | Admitting: *Deleted

## 2022-08-24 ENCOUNTER — Ambulatory Visit (HOSPITAL_COMMUNITY)
Admission: RE | Admit: 2022-08-24 | Discharge: 2022-08-24 | Disposition: A | Discharge status: Home / Self Care | Payer: BC Managed Care – PPO | Source: Ambulatory Visit | Attending: Internal Medicine | Admitting: Internal Medicine

## 2022-08-24 ENCOUNTER — Encounter (HOSPITAL_COMMUNITY): Payer: Self-pay

## 2022-08-24 DIAGNOSIS — M810 Age-related osteoporosis without current pathological fracture: Secondary | ICD-10-CM | POA: Insufficient documentation

## 2022-08-24 MED ORDER — DENOSUMAB 60 MG/ML ~~LOC~~ SOSY
PREFILLED_SYRINGE | SUBCUTANEOUS | Status: AC
  Filled 2022-08-24: qty 1

## 2022-08-24 MED ORDER — DENOSUMAB 60 MG/ML ~~LOC~~ SOSY: 60.0000 mg | PREFILLED_SYRINGE | Freq: Once | SUBCUTANEOUS | Status: DC

## 2022-08-28 DIAGNOSIS — R7301 Impaired fasting glucose: Secondary | ICD-10-CM | POA: Diagnosis not present

## 2022-08-28 DIAGNOSIS — F419 Anxiety disorder, unspecified: Secondary | ICD-10-CM | POA: Diagnosis not present

## 2022-08-28 DIAGNOSIS — M810 Age-related osteoporosis without current pathological fracture: Secondary | ICD-10-CM | POA: Diagnosis not present

## 2022-10-03 DIAGNOSIS — H34831 Tributary (branch) retinal vein occlusion, right eye, with macular edema: Secondary | ICD-10-CM | POA: Diagnosis not present

## 2022-10-03 DIAGNOSIS — H35362 Drusen (degenerative) of macula, left eye: Secondary | ICD-10-CM | POA: Diagnosis not present

## 2022-10-03 DIAGNOSIS — H348322 Tributary (branch) retinal vein occlusion, left eye, stable: Secondary | ICD-10-CM | POA: Diagnosis not present

## 2022-10-03 DIAGNOSIS — H43812 Vitreous degeneration, left eye: Secondary | ICD-10-CM | POA: Diagnosis not present

## 2022-10-11 ENCOUNTER — Other Ambulatory Visit (HOSPITAL_COMMUNITY): Payer: Self-pay

## 2022-10-13 ENCOUNTER — Ambulatory Visit (HOSPITAL_COMMUNITY)
Admission: RE | Admit: 2022-10-13 | Discharge: 2022-10-13 | Disposition: A | Discharge status: Home / Self Care | Payer: Medicare HMO | Source: Ambulatory Visit | Attending: Internal Medicine | Admitting: Internal Medicine

## 2022-10-13 DIAGNOSIS — I10 Essential (primary) hypertension: Secondary | ICD-10-CM | POA: Diagnosis not present

## 2022-10-13 DIAGNOSIS — F419 Anxiety disorder, unspecified: Secondary | ICD-10-CM | POA: Diagnosis not present

## 2022-10-13 DIAGNOSIS — K219 Gastro-esophageal reflux disease without esophagitis: Secondary | ICD-10-CM | POA: Diagnosis not present

## 2022-10-13 DIAGNOSIS — M810 Age-related osteoporosis without current pathological fracture: Secondary | ICD-10-CM | POA: Diagnosis present

## 2022-10-13 DIAGNOSIS — R82998 Other abnormal findings in urine: Secondary | ICD-10-CM | POA: Diagnosis not present

## 2022-10-13 DIAGNOSIS — R69 Illness, unspecified: Secondary | ICD-10-CM | POA: Diagnosis not present

## 2022-10-13 DIAGNOSIS — R7301 Impaired fasting glucose: Secondary | ICD-10-CM | POA: Diagnosis not present

## 2022-10-13 DIAGNOSIS — Z1339 Encounter for screening examination for other mental health and behavioral disorders: Secondary | ICD-10-CM | POA: Diagnosis not present

## 2022-10-13 DIAGNOSIS — H353210 Exudative age-related macular degeneration, right eye, stage unspecified: Secondary | ICD-10-CM | POA: Diagnosis not present

## 2022-10-13 DIAGNOSIS — E7800 Pure hypercholesterolemia, unspecified: Secondary | ICD-10-CM | POA: Diagnosis not present

## 2022-10-13 DIAGNOSIS — Z0000 Encounter for general adult medical examination without abnormal findings: Secondary | ICD-10-CM | POA: Diagnosis not present

## 2022-10-13 DIAGNOSIS — Z1331 Encounter for screening for depression: Secondary | ICD-10-CM | POA: Diagnosis not present

## 2022-10-13 MED ORDER — DENOSUMAB 60 MG/ML ~~LOC~~ SOSY
PREFILLED_SYRINGE | SUBCUTANEOUS | Status: AC
  Filled 2022-10-13: qty 1

## 2022-10-13 MED ORDER — DENOSUMAB 60 MG/ML ~~LOC~~ SOSY
60.0000 mg | PREFILLED_SYRINGE | Freq: Once | SUBCUTANEOUS | Status: AC
  Administered 2022-10-13: 60 mg via SUBCUTANEOUS

## 2022-10-20 DIAGNOSIS — N390 Urinary tract infection, site not specified: Secondary | ICD-10-CM | POA: Diagnosis not present

## 2022-11-14 DIAGNOSIS — H35362 Drusen (degenerative) of macula, left eye: Secondary | ICD-10-CM | POA: Diagnosis not present

## 2022-11-14 DIAGNOSIS — H348322 Tributary (branch) retinal vein occlusion, left eye, stable: Secondary | ICD-10-CM | POA: Diagnosis not present

## 2022-11-14 DIAGNOSIS — H43812 Vitreous degeneration, left eye: Secondary | ICD-10-CM | POA: Diagnosis not present

## 2022-11-14 DIAGNOSIS — H34831 Tributary (branch) retinal vein occlusion, right eye, with macular edema: Secondary | ICD-10-CM | POA: Diagnosis not present

## 2022-12-15 DIAGNOSIS — H353122 Nonexudative age-related macular degeneration, left eye, intermediate dry stage: Secondary | ICD-10-CM | POA: Diagnosis not present

## 2022-12-15 DIAGNOSIS — H43812 Vitreous degeneration, left eye: Secondary | ICD-10-CM | POA: Diagnosis not present

## 2022-12-15 DIAGNOSIS — H34833 Tributary (branch) retinal vein occlusion, bilateral, with macular edema: Secondary | ICD-10-CM | POA: Diagnosis not present

## 2023-01-23 DIAGNOSIS — H348322 Tributary (branch) retinal vein occlusion, left eye, stable: Secondary | ICD-10-CM | POA: Diagnosis not present

## 2023-01-23 DIAGNOSIS — H35362 Drusen (degenerative) of macula, left eye: Secondary | ICD-10-CM | POA: Diagnosis not present

## 2023-01-23 DIAGNOSIS — H43812 Vitreous degeneration, left eye: Secondary | ICD-10-CM | POA: Diagnosis not present

## 2023-01-23 DIAGNOSIS — H348311 Tributary (branch) retinal vein occlusion, right eye, with retinal neovascularization: Secondary | ICD-10-CM | POA: Diagnosis not present

## 2023-02-23 DIAGNOSIS — H348312 Tributary (branch) retinal vein occlusion, right eye, stable: Secondary | ICD-10-CM | POA: Diagnosis not present

## 2023-02-23 DIAGNOSIS — H35362 Drusen (degenerative) of macula, left eye: Secondary | ICD-10-CM | POA: Diagnosis not present

## 2023-02-27 DIAGNOSIS — D0471 Carcinoma in situ of skin of right lower limb, including hip: Secondary | ICD-10-CM | POA: Diagnosis not present

## 2023-02-27 DIAGNOSIS — Z85828 Personal history of other malignant neoplasm of skin: Secondary | ICD-10-CM | POA: Diagnosis not present

## 2023-02-27 DIAGNOSIS — L570 Actinic keratosis: Secondary | ICD-10-CM | POA: Diagnosis not present

## 2023-02-27 DIAGNOSIS — L821 Other seborrheic keratosis: Secondary | ICD-10-CM | POA: Diagnosis not present

## 2023-02-27 DIAGNOSIS — D0472 Carcinoma in situ of skin of left lower limb, including hip: Secondary | ICD-10-CM | POA: Diagnosis not present

## 2023-03-16 DIAGNOSIS — Z85828 Personal history of other malignant neoplasm of skin: Secondary | ICD-10-CM | POA: Diagnosis not present

## 2023-03-16 DIAGNOSIS — L570 Actinic keratosis: Secondary | ICD-10-CM | POA: Diagnosis not present

## 2023-03-16 DIAGNOSIS — D0472 Carcinoma in situ of skin of left lower limb, including hip: Secondary | ICD-10-CM | POA: Diagnosis not present

## 2023-03-16 DIAGNOSIS — L821 Other seborrheic keratosis: Secondary | ICD-10-CM | POA: Diagnosis not present

## 2023-03-19 DIAGNOSIS — M25561 Pain in right knee: Secondary | ICD-10-CM | POA: Diagnosis not present

## 2023-03-19 DIAGNOSIS — M25562 Pain in left knee: Secondary | ICD-10-CM | POA: Diagnosis not present

## 2023-03-23 DIAGNOSIS — H348322 Tributary (branch) retinal vein occlusion, left eye, stable: Secondary | ICD-10-CM | POA: Diagnosis not present

## 2023-03-23 DIAGNOSIS — H34831 Tributary (branch) retinal vein occlusion, right eye, with macular edema: Secondary | ICD-10-CM | POA: Diagnosis not present

## 2023-03-23 DIAGNOSIS — H43812 Vitreous degeneration, left eye: Secondary | ICD-10-CM | POA: Diagnosis not present

## 2023-03-23 DIAGNOSIS — H35362 Drusen (degenerative) of macula, left eye: Secondary | ICD-10-CM | POA: Diagnosis not present

## 2023-04-23 ENCOUNTER — Other Ambulatory Visit (HOSPITAL_COMMUNITY): Payer: Self-pay | Admitting: *Deleted

## 2023-04-24 DIAGNOSIS — H348322 Tributary (branch) retinal vein occlusion, left eye, stable: Secondary | ICD-10-CM | POA: Diagnosis not present

## 2023-04-24 DIAGNOSIS — H34831 Tributary (branch) retinal vein occlusion, right eye, with macular edema: Secondary | ICD-10-CM | POA: Diagnosis not present

## 2023-04-24 DIAGNOSIS — H43812 Vitreous degeneration, left eye: Secondary | ICD-10-CM | POA: Diagnosis not present

## 2023-04-24 DIAGNOSIS — H35362 Drusen (degenerative) of macula, left eye: Secondary | ICD-10-CM | POA: Diagnosis not present

## 2023-04-26 ENCOUNTER — Ambulatory Visit (HOSPITAL_COMMUNITY)
Admission: RE | Admit: 2023-04-26 | Discharge: 2023-04-26 | Disposition: A | Discharge status: Home / Self Care | Payer: Medicare HMO | Source: Ambulatory Visit | Attending: Internal Medicine | Admitting: Internal Medicine

## 2023-04-26 DIAGNOSIS — M810 Age-related osteoporosis without current pathological fracture: Secondary | ICD-10-CM | POA: Insufficient documentation

## 2023-04-26 MED ORDER — DENOSUMAB 60 MG/ML ~~LOC~~ SOSY: 60.0000 mg | PREFILLED_SYRINGE | Freq: Once | SUBCUTANEOUS | Status: AC

## 2023-04-26 MED ORDER — DENOSUMAB 60 MG/ML ~~LOC~~ SOSY
PREFILLED_SYRINGE | SUBCUTANEOUS | Status: AC
  Administered 2023-04-26: 60 mg via SUBCUTANEOUS
  Filled 2023-04-26: qty 1

## 2023-05-22 DIAGNOSIS — H348322 Tributary (branch) retinal vein occlusion, left eye, stable: Secondary | ICD-10-CM | POA: Diagnosis not present

## 2023-05-22 DIAGNOSIS — H35362 Drusen (degenerative) of macula, left eye: Secondary | ICD-10-CM | POA: Diagnosis not present

## 2023-05-22 DIAGNOSIS — H43812 Vitreous degeneration, left eye: Secondary | ICD-10-CM | POA: Diagnosis not present

## 2023-05-22 DIAGNOSIS — H34831 Tributary (branch) retinal vein occlusion, right eye, with macular edema: Secondary | ICD-10-CM | POA: Diagnosis not present

## 2023-05-29 DIAGNOSIS — I10 Essential (primary) hypertension: Secondary | ICD-10-CM | POA: Diagnosis not present

## 2023-05-29 DIAGNOSIS — R051 Acute cough: Secondary | ICD-10-CM | POA: Diagnosis not present

## 2023-05-29 DIAGNOSIS — Z1152 Encounter for screening for COVID-19: Secondary | ICD-10-CM | POA: Diagnosis not present

## 2023-06-19 DIAGNOSIS — H348322 Tributary (branch) retinal vein occlusion, left eye, stable: Secondary | ICD-10-CM | POA: Diagnosis not present

## 2023-06-19 DIAGNOSIS — H43812 Vitreous degeneration, left eye: Secondary | ICD-10-CM | POA: Diagnosis not present

## 2023-06-19 DIAGNOSIS — H34831 Tributary (branch) retinal vein occlusion, right eye, with macular edema: Secondary | ICD-10-CM | POA: Diagnosis not present

## 2023-06-19 DIAGNOSIS — H35362 Drusen (degenerative) of macula, left eye: Secondary | ICD-10-CM | POA: Diagnosis not present

## 2023-06-26 DIAGNOSIS — M25562 Pain in left knee: Secondary | ICD-10-CM | POA: Diagnosis not present

## 2023-06-26 DIAGNOSIS — M25572 Pain in left ankle and joints of left foot: Secondary | ICD-10-CM | POA: Diagnosis not present

## 2023-06-26 DIAGNOSIS — M25561 Pain in right knee: Secondary | ICD-10-CM | POA: Diagnosis not present

## 2023-07-07 ENCOUNTER — Emergency Department (HOSPITAL_COMMUNITY)
Admission: EM | Admit: 2023-07-07 | Discharge: 2023-07-07 | Disposition: A | Discharge status: Home / Self Care | Payer: Medicare HMO | Attending: Emergency Medicine | Admitting: Emergency Medicine

## 2023-07-07 ENCOUNTER — Other Ambulatory Visit: Payer: Self-pay

## 2023-07-07 ENCOUNTER — Emergency Department (HOSPITAL_COMMUNITY): Payer: Medicare HMO

## 2023-07-07 ENCOUNTER — Encounter (HOSPITAL_COMMUNITY): Payer: Self-pay

## 2023-07-07 DIAGNOSIS — G459 Transient cerebral ischemic attack, unspecified: Secondary | ICD-10-CM | POA: Diagnosis not present

## 2023-07-07 DIAGNOSIS — Z79899 Other long term (current) drug therapy: Secondary | ICD-10-CM | POA: Insufficient documentation

## 2023-07-07 DIAGNOSIS — I6523 Occlusion and stenosis of bilateral carotid arteries: Secondary | ICD-10-CM | POA: Diagnosis not present

## 2023-07-07 DIAGNOSIS — I771 Stricture of artery: Secondary | ICD-10-CM | POA: Diagnosis not present

## 2023-07-07 DIAGNOSIS — R4781 Slurred speech: Secondary | ICD-10-CM | POA: Diagnosis not present

## 2023-07-07 DIAGNOSIS — R29818 Other symptoms and signs involving the nervous system: Secondary | ICD-10-CM | POA: Diagnosis not present

## 2023-07-07 DIAGNOSIS — I10 Essential (primary) hypertension: Secondary | ICD-10-CM | POA: Diagnosis not present

## 2023-07-07 DIAGNOSIS — Z961 Presence of intraocular lens: Secondary | ICD-10-CM | POA: Diagnosis not present

## 2023-07-07 LAB — DIFFERENTIAL
Abs Immature Granulocytes: 0.08 10*3/uL — ABNORMAL HIGH (ref 0.00–0.07)
Basophils Absolute: 0 10*3/uL (ref 0.0–0.1)
Basophils Relative: 1 %
Eosinophils Absolute: 0 10*3/uL (ref 0.0–0.5)
Eosinophils Relative: 1 %
Immature Granulocytes: 1 %
Lymphocytes Relative: 30 %
Lymphs Abs: 1.9 10*3/uL (ref 0.7–4.0)
Monocytes Absolute: 0.6 10*3/uL (ref 0.1–1.0)
Monocytes Relative: 9 %
Neutro Abs: 3.8 10*3/uL (ref 1.7–7.7)
Neutrophils Relative %: 58 %

## 2023-07-07 LAB — I-STAT CHEM 8, ED
BUN: 37 mg/dL — ABNORMAL HIGH (ref 8–23)
Calcium, Ion: 1.26 mmol/L (ref 1.15–1.40)
Chloride: 106 mmol/L (ref 98–111)
Creatinine, Ser: 1 mg/dL (ref 0.44–1.00)
Glucose, Bld: 100 mg/dL — ABNORMAL HIGH (ref 70–99)
HCT: 42 % (ref 36.0–46.0)
Hemoglobin: 14.3 g/dL (ref 12.0–15.0)
Potassium: 4.2 mmol/L (ref 3.5–5.1)
Sodium: 138 mmol/L (ref 135–145)
TCO2: 22 mmol/L (ref 22–32)

## 2023-07-07 LAB — CBC
HCT: 43.7 % (ref 36.0–46.0)
Hemoglobin: 14.3 g/dL (ref 12.0–15.0)
MCH: 34.2 pg — ABNORMAL HIGH (ref 26.0–34.0)
MCHC: 32.7 g/dL (ref 30.0–36.0)
MCV: 104.5 fL — ABNORMAL HIGH (ref 80.0–100.0)
Platelets: 197 10*3/uL (ref 150–400)
RBC: 4.18 MIL/uL (ref 3.87–5.11)
RDW: 12.5 % (ref 11.5–15.5)
WBC: 6.5 10*3/uL (ref 4.0–10.5)
nRBC: 0 % (ref 0.0–0.2)

## 2023-07-07 LAB — PROTIME-INR
INR: 1 (ref 0.8–1.2)
Prothrombin Time: 13 seconds (ref 11.4–15.2)

## 2023-07-07 LAB — COMPREHENSIVE METABOLIC PANEL
ALT: 30 U/L (ref 0–44)
AST: 21 U/L (ref 15–41)
Albumin: 4.1 g/dL (ref 3.5–5.0)
Alkaline Phosphatase: 28 U/L — ABNORMAL LOW (ref 38–126)
Anion gap: 10 (ref 5–15)
BUN: 36 mg/dL — ABNORMAL HIGH (ref 8–23)
CO2: 24 mmol/L (ref 22–32)
Calcium: 9.8 mg/dL (ref 8.9–10.3)
Chloride: 104 mmol/L (ref 98–111)
Creatinine, Ser: 1.08 mg/dL — ABNORMAL HIGH (ref 0.44–1.00)
GFR, Estimated: 52 mL/min — ABNORMAL LOW (ref >60)
Glucose, Bld: 103 mg/dL — ABNORMAL HIGH (ref 70–99)
Potassium: 4.2 mmol/L (ref 3.5–5.1)
Sodium: 138 mmol/L (ref 135–145)
Total Bilirubin: 1.1 mg/dL (ref 0.3–1.2)
Total Protein: 6.8 g/dL (ref 6.5–8.1)

## 2023-07-07 LAB — CBG MONITORING, ED: Glucose-Capillary: 96 mg/dL (ref 70–99)

## 2023-07-07 LAB — ETHANOL: Alcohol, Ethyl (B): <10 mg/dL (ref <10)

## 2023-07-07 LAB — APTT: aPTT: 30 seconds (ref 24–36)

## 2023-07-07 MED ORDER — SODIUM CHLORIDE 0.9% FLUSH
3.0000 mL | Freq: Once | INTRAVENOUS | Status: AC
  Administered 2023-07-07: 3 mL via INTRAVENOUS

## 2023-07-07 MED ORDER — IOHEXOL 350 MG/ML SOLN
75.0000 mL | Freq: Once | INTRAVENOUS | Status: AC | PRN
  Administered 2023-07-07: 75 mL via INTRAVENOUS

## 2023-07-07 NOTE — ED Provider Notes (Signed)
[place] [facility]MENT AT [facility] Provider Note   CSN: [id number] Arrival date & time: 07/07/23  1339     History  Chief Complaint  Patient presents with   Stroke Symptoms    Chelsey Huff is a 82 y.o. female.  HPI   Patient has a history of fibroids osteoporosis hypertension who presents to the ED for evaluation of leaning to her right side.  Patient states she was packing today when she felt like she was leaning to the right.  Patient states she was still able to walk.  She was not having any trouble with her vision or speech.  She did not feel any weakness in her arms.  Those symptoms lasted for about 2 hours and then resolved.  Patient states she feels fine now.  No prior history of similar symptoms  Home Medications Prior to Admission medications   Medication Sig Start Date End Date Taking? Authorizing Provider  alendronate (FOSAMAX) 70 MG tablet TK 1 T PO ONCE WEEKLY 09/06/18   Valeria Batman, MD  amoxicillin (AMOXIL) 875 MG tablet TK 1 T PO Q 12 H 08/28/18   [provider]  calcium-vitamin D (OSCAL WITH D) 500-200 MG-UNIT tablet Take 1 tablet by mouth 3 (three) times daily.    [provider]  Cholecalciferol (VITAMIN D) 2000 units CAPS Take 4,000 Units by mouth daily.    [provider]  citalopram (CELEXA) 20 MG tablet TK 1 T PO QD 09/15/16   [provider]  methotrexate (RHEUMATREX) 2.5 MG tablet Take 2.5 mg by mouth daily. Caution:Chemotherapy. Protect from light.    [provider]  multivitamin [facility]) per tablet Take 1 tablet by mouth daily.      [provider]  pantoprazole (PROTONIX) 40 MG tablet Take 40 mg by mouth daily.    [provider]  simvastatin (ZOCOR) 20 MG tablet Take 10 mg by mouth daily.    [provider]  valsartan-hydrochlorothiazide (DIOVAN-HCT) 320-25 MG per tablet Take 0.5 tablets by mouth daily.    [provider]  zolpidem  (AMBIEN) 5 MG tablet TK 1 T PO  QHS PRF SLEEP 09/27/16   [provider]      Allergies    Codeine and Other    Review of Systems   Review of Systems  Physical Exam Updated Vital Signs BP (!) 157/62   Pulse 70   Temp 97.9 F (36.6 C) (Oral)   Resp 14   Ht 1.632 m (5' 4.25")   Wt 68 kg   SpO2 100%   BMI 25.53 kg/m  Physical Exam Vitals and nursing note reviewed.  Constitutional:      General: She is not in acute distress.    Appearance: She is well-developed.  HENT:     Head: Normocephalic and atraumatic.     Right Ear: External ear normal.     Left Ear: External ear normal.  Eyes:     General: No visual field deficit or scleral icterus.       Right eye: No discharge.        Left eye: No discharge.     Conjunctiva/sclera: Conjunctivae normal.  Neck:     Trachea: No tracheal deviation.  Cardiovascular:     Rate and Rhythm: Normal rate and regular rhythm.  Pulmonary:     Effort: Pulmonary effort is normal. No respiratory distress.     Breath sounds: Normal breath sounds. No stridor. No wheezing or rales.  Abdominal:     General: Bowel sounds are normal. There is no distension.     Palpations: Abdomen is soft.     Tenderness: There is no abdominal tenderness. There is no guarding or rebound.  Musculoskeletal:        General: No tenderness or deformity.     Cervical back: Neck supple.  Skin:    General: Skin is warm and dry.     Findings: No rash.  Neurological:     General: No focal deficit present.     Mental Status: She is alert and oriented to person, place, and time.     Cranial Nerves: No cranial nerve deficit, dysarthria or facial asymmetry.     Sensory: No sensory deficit.     Motor: No abnormal muscle tone, seizure activity or pronator drift.     Coordination: Coordination normal.     Comments:  able to hold both legs off bed for 5 seconds, sensation intact in all extremities,  no left or right sided neglect, normal finger-nose exam bilaterally,  no nystagmus noted   Psychiatric:        Mood and Affect: Mood normal.     ED Results / Procedures / Treatments   Labs (all labs ordered are listed, but only abnormal results are displayed) Labs Reviewed  CBC - Abnormal; Notable for the following components:      Result Value   MCV 104.5 (*)    MCH 34.2 (*)    All other components within normal limits  DIFFERENTIAL - Abnormal; Notable for the following components:   Abs Immature Granulocytes 0.08 (*)    All other components within normal limits  COMPREHENSIVE METABOLIC PANEL - Abnormal; Notable for the following components:   Glucose, Bld 103 (*)    BUN 36 (*)    Creatinine, Ser 1.08 (*)    Alkaline Phosphatase 28 (*)    GFR, Estimated 52 (*)    All other components within normal limits  I-STAT CHEM 8, ED - Abnormal; Notable for the following components:   BUN 37 (*)    Glucose, Bld 100 (*)    All other components within normal limits  PROTIME-INR  APTT  ETHANOL  CBG MONITORING, ED    EKG None  Radiology MR BRAIN WO CONTRAST  Result Date: 07/07/2023 CLINICAL DATA:  Stroke suspected EXAM: MRI HEAD WITHOUT CONTRAST TECHNIQUE: Multiplanar, multiecho pulse sequences of the brain and surrounding structures were obtained without intravenous contrast. COMPARISON:  No prior MRI available, correlation is made with CT head 07/07/2023 FINDINGS: Brain: No restricted diffusion to suggest acute or subacute infarct. No acute hemorrhage, mass, mass effect, or midline shift. No hydrocephalus or extra-axial collection. Normal pituitary and craniocervical junction. No hemosiderin deposition to suggest remote hemorrhage. Remote lacunar infarcts in the pons and basal ganglia. Scattered T2 hyperintense signal in the periventricular white matter, likely the sequela of mild-to-moderate chronic small vessel ischemic disease. Vascular: Normal arterial flow voids. Skull and upper cervical spine: Normal marrow signal. Sinuses/Orbits: Clear paranasal  sinuses. No acute finding in the orbits. Status post bilateral lens replacements. Other: The mastoid air cells are well aerated. IMPRESSION: No acute intracranial process. No evidence of acute or subacute infarct. Electronically Signed   By: Wiliam Ke M.D.   On: 07/07/2023 20:01   CT ANGIO HEAD NECK W WO CM  Result Date: 07/07/2023 CLINICAL DATA:  Neuro deficit, acute, stroke suspected. Patient leaning to the right. EXAM: CT ANGIOGRAPHY HEAD AND NECK WITH AND WITHOUT CONTRAST  TECHNIQUE: Multidetector CT imaging of the head and neck was performed using the standard protocol during bolus administration of intravenous contrast. Multiplanar CT image reconstructions and MIPs were obtained to evaluate the vascular anatomy. Carotid stenosis measurements (when applicable) are obtained utilizing NASCET criteria, using the distal internal carotid diameter as the denominator. RADIATION DOSE REDUCTION: This exam was performed according to the departmental dose-optimization program which includes automated exposure control, adjustment of the mA and/or kV according to patient size and/or use of iterative reconstruction technique. CONTRAST:  75mL OMNIPAQUE IOHEXOL 350 MG/ML SOLN COMPARISON:  CT head without contrast 07/07/2023 FINDINGS: CT HEAD FINDINGS Brain: No acute infarct, hemorrhage, or mass lesion is present. The ventricles are of normal size. No significant extraaxial fluid collection is present. No significant white matter lesions are present. Deep brain nuclei are within normal limits. The brainstem and cerebellum are within normal limits. Midline structures are within normal limits. Vascular: Minimal atherosclerotic calcifications are present within the cavernous internal carotid arteries bilaterally. No hyperdense vessel is present. Skull: Calvarium is intact. No focal lytic or blastic lesions are present. No significant extracranial soft tissue lesion is present. Sinuses/Orbits: The paranasal sinuses and  mastoid air cells are clear. Bilateral lens replacements are noted. Globes and orbits are otherwise unremarkable. Other: Review of the MIP images confirms the above findings CTA NECK FINDINGS Aortic arch: A 4 vessel arch configuration is present. The left vertebral artery originates directly from the aortic arch. No significant stenosis or atherosclerotic changes present the arch. Right carotid system: Right common carotid artery is mildly tortuous without focal stenosis. Atherosclerotic calcifications are present at the right carotid bifurcation without significant stenosis. The cervical right ICA demonstrates mild distal tortuosity without significant stenosis. Left carotid system: The left common carotid artery is within normal limits. Will repeat the dense calcifications are present at the carotid bifurcation. The lumen is narrowed to 1.75 mm. This compares with a more distal III 0.5 mm. Vertebral arteries: The left vertebral artery is dominant. The right vertebral artery originates from the subclavian artery without significant stenosis. No significant stenosis is present in either vertebral artery in the neck. Skeleton: The vertebral body heights normal. Slight degenerative anterolisthesis present at C4-5. No focal osseous lesions are present. Other neck: Soft tissues the neck are otherwise unremarkable. Salivary glands are within normal limits. Thyroid is normal. No significant adenopathy is present. No focal mucosal or submucosal lesions are present. Upper chest: The lung apices are clear. The thoracic inlet is within normal limits. Review of the MIP images confirms the above findings CTA HEAD FINDINGS Anterior circulation: Minimal calcifications are present within the cavernous internal carotid arteries without the significant stenosis through the ICA termini. The A1 and M1 segments are normal. The anterior communicating artery is patent. The MCA bifurcations are normal. The ACA and MCA branch vessels are  normal bilaterally. Posterior circulation: The PICA origins are visualized and normal. The vertebrobasilar junction and basilar artery are normal. The superior cerebellar arteries are patent. Both posterior cerebral arteries originate from the basilar tip. The PCA branch vessels are normal bilaterally. Venous sinuses: The dural sinuses are patent. The straight sinus and deep cerebral veins are intact. Cortical veins are within normal limits. No significant vascular malformation is evident. Anatomic variants: None Review of the MIP images confirms the above findings IMPRESSION: 1. No acute or focal lesion to explain the patient's symptoms. 2. 50% stenosis of the proximal left internal carotid artery relative to the more distal vessel with dense calcifications. 3. Atherosclerotic  changes at the right carotid bifurcation without significant stenosis. 4. Minimal calcifications within the cavernous internal carotid arteries bilaterally without significant stenosis. 5. Normal variant CTA Circle of Willis without significant proximal stenosis, aneurysm, or branch vessel occlusion. Electronically Signed   By: Marin Roberts M.D.   On: 07/07/2023 17:37   CT HEAD WO CONTRAST  Result Date: 07/07/2023 CLINICAL DATA:  Neurological deficit. EXAM: CT HEAD WITHOUT CONTRAST TECHNIQUE: Contiguous axial images were obtained from the base of the skull through the vertex without intravenous contrast. RADIATION DOSE REDUCTION: This exam was performed according to the departmental dose-optimization program which includes automated exposure control, adjustment of the mA and/or kV according to patient size and/or use of iterative reconstruction technique. COMPARISON:  None Available. FINDINGS: Brain: Ventricles, cisterns and other CSF spaces are normal. There is mild chronic ischemic microvascular disease. There is no mass, mass effect, shift of midline structures or acute hemorrhage. No evidence of acute infarction. Vascular: No  hyperdense vessel or unexpected calcification. Skull: Normal. Negative for fracture or focal lesion. Sinuses/Orbits: No acute finding. Other: None. IMPRESSION: 1. No acute findings. 2. Mild chronic ischemic microvascular disease. Electronically Signed   By: Elberta Fortis M.D.   On: 07/07/2023 15:10    Procedures Procedures    Medications Ordered in ED Medications  sodium chloride flush (NS) 0.9 % injection 3 mL (3 mLs Intravenous Given 07/07/23 1657)  iohexol (OMNIPAQUE) 350 MG/ML injection 75 mL (75 mLs Intravenous Contrast Given 07/07/23 1656)    ED Course/ Medical Decision Making/ A&P Clinical Course as of 07/07/23 2048  Sat Jul 07, 2023  1607 Head CT without acute findings.  CBC and metabolic panel unremarkable. [JK]  1801 CT head and angio without acute findings.  50% stenosis of the proximal left internal carotid artery no acute blockages noted [JK]  2014 MRI without acute process [JK]    Clinical Course User Index [JK] Linwood Dibbles, MD                                 Medical Decision Making Problems Addressed: TIA (transient ischemic attack): acute illness or injury  Amount and/or Complexity of Data Reviewed Labs: ordered. Decision-making details documented in ED Course. Radiology: ordered and independent interpretation performed.  Risk Prescription drug management.   Patient presented to the ED for evaluation of an episode of leaning to 1 side.  Patient however states she was not having any difficulty walking.  Patient was not having a difficulty with her speech.  Symptoms have all resolved.  Presentation concerning for TIA although somewhat atypical and that she was not having any ataxia despite this feeling that she was leaning to 1 side.  ED workup reassuring.  CT angiogram shows evidence of carotid stenosis but no significant obstruction.  Patient does not have any evidence of stroke on MRI.  Her neurologic exam is normal and she is at her baseline.  Patient appears stable  for discharge and close outpatient follow-up.  Strict warning signs and precautions discussed        Final Clinical Impression(s) / ED Diagnoses Final diagnoses:  TIA (transient ischemic attack)    Rx / DC Orders ED Discharge Orders     None         Linwood Dibbles, MD 07/07/23 2051

## 2023-07-07 NOTE — Discharge Instructions (Addendum)
Follow-up with your primary care doctor to be rechecked as we discussed.  Return to the ED immediately for any recurrent symptoms

## 2023-07-07 NOTE — ED Triage Notes (Signed)
Pt states she [place] go [place] and she was leaning to right side this morning.Pt states this started at 0800 this morning. Pt states she was already up. LKW today at [phone]

## 2023-07-09 DIAGNOSIS — F419 Anxiety disorder, unspecified: Secondary | ICD-10-CM | POA: Diagnosis not present

## 2023-07-09 DIAGNOSIS — I10 Essential (primary) hypertension: Secondary | ICD-10-CM | POA: Diagnosis not present

## 2023-07-09 DIAGNOSIS — E785 Hyperlipidemia, unspecified: Secondary | ICD-10-CM | POA: Diagnosis not present

## 2023-07-09 DIAGNOSIS — R2681 Unsteadiness on feet: Secondary | ICD-10-CM | POA: Diagnosis not present

## 2023-07-09 DIAGNOSIS — I6522 Occlusion and stenosis of left carotid artery: Secondary | ICD-10-CM | POA: Diagnosis not present

## 2023-07-25 DIAGNOSIS — H35351 Cystoid macular degeneration, right eye: Secondary | ICD-10-CM | POA: Diagnosis not present

## 2023-07-25 DIAGNOSIS — H43813 Vitreous degeneration, bilateral: Secondary | ICD-10-CM | POA: Diagnosis not present

## 2023-07-25 DIAGNOSIS — H34831 Tributary (branch) retinal vein occlusion, right eye, with macular edema: Secondary | ICD-10-CM | POA: Diagnosis not present

## 2023-07-25 DIAGNOSIS — H353131 Nonexudative age-related macular degeneration, bilateral, early dry stage: Secondary | ICD-10-CM | POA: Diagnosis not present

## 2023-07-25 DIAGNOSIS — H35371 Puckering of macula, right eye: Secondary | ICD-10-CM | POA: Diagnosis not present

## 2023-08-09 ENCOUNTER — Telehealth: Payer: Self-pay

## 2023-08-09 NOTE — Telephone Encounter (Signed)
Transition Care Management Unsuccessful Follow-up Telephone Call  Date of discharge and from where:  [facility] 9/[facility]  Attempts:  1st Attempt  Reason for unsuccessful TCM follow-up call:  No answer/busy   Lenard Forth Babbie  [facility], [facility] Guide, Phone: [phone] Website: [email or web address]

## 2023-08-10 ENCOUNTER — Telehealth: Payer: Self-pay

## 2023-08-10 NOTE — Telephone Encounter (Signed)
Transition Care Management Unsuccessful Follow-up Telephone Call  Date of discharge and from where:  [facility]  Attempts:  2nd Attempt  Reason for unsuccessful TCM follow-up call:  No answer/busy   Chelsey Huff  [facility], [facility] Guide, Phone: [phone] Website: [email or web address]

## 2023-08-22 DIAGNOSIS — H35371 Puckering of macula, right eye: Secondary | ICD-10-CM | POA: Diagnosis not present

## 2023-08-22 DIAGNOSIS — H353131 Nonexudative age-related macular degeneration, bilateral, early dry stage: Secondary | ICD-10-CM | POA: Diagnosis not present

## 2023-08-22 DIAGNOSIS — H35351 Cystoid macular degeneration, right eye: Secondary | ICD-10-CM | POA: Diagnosis not present

## 2023-08-22 DIAGNOSIS — H34831 Tributary (branch) retinal vein occlusion, right eye, with macular edema: Secondary | ICD-10-CM | POA: Diagnosis not present

## 2023-09-14 DIAGNOSIS — E785 Hyperlipidemia, unspecified: Secondary | ICD-10-CM | POA: Diagnosis not present

## 2023-09-14 DIAGNOSIS — R4182 Altered mental status, unspecified: Secondary | ICD-10-CM | POA: Diagnosis not present

## 2023-09-14 DIAGNOSIS — R404 Transient alteration of awareness: Secondary | ICD-10-CM | POA: Diagnosis not present

## 2023-09-14 DIAGNOSIS — G9389 Other specified disorders of brain: Secondary | ICD-10-CM | POA: Diagnosis not present

## 2023-09-14 DIAGNOSIS — G459 Transient cerebral ischemic attack, unspecified: Secondary | ICD-10-CM | POA: Diagnosis not present

## 2023-09-14 DIAGNOSIS — R9431 Abnormal electrocardiogram [ECG] [EKG]: Secondary | ICD-10-CM | POA: Diagnosis not present

## 2023-09-14 DIAGNOSIS — I5A Non-ischemic myocardial injury (non-traumatic): Secondary | ICD-10-CM | POA: Diagnosis not present

## 2023-09-14 DIAGNOSIS — Z743 Need for continuous supervision: Secondary | ICD-10-CM | POA: Diagnosis not present

## 2023-09-14 DIAGNOSIS — R29898 Other symptoms and signs involving the musculoskeletal system: Secondary | ICD-10-CM | POA: Diagnosis not present

## 2023-09-14 DIAGNOSIS — R748 Abnormal levels of other serum enzymes: Secondary | ICD-10-CM | POA: Diagnosis not present

## 2023-09-14 DIAGNOSIS — I4891 Unspecified atrial fibrillation: Secondary | ICD-10-CM | POA: Diagnosis not present

## 2023-09-14 DIAGNOSIS — R531 Weakness: Secondary | ICD-10-CM | POA: Diagnosis not present

## 2023-09-14 DIAGNOSIS — R4781 Slurred speech: Secondary | ICD-10-CM | POA: Diagnosis not present

## 2023-09-15 DIAGNOSIS — G459 Transient cerebral ischemic attack, unspecified: Secondary | ICD-10-CM | POA: Diagnosis not present

## 2023-09-15 DIAGNOSIS — R4781 Slurred speech: Secondary | ICD-10-CM | POA: Diagnosis not present

## 2023-09-15 DIAGNOSIS — I480 Paroxysmal atrial fibrillation: Secondary | ICD-10-CM | POA: Diagnosis not present

## 2023-09-15 DIAGNOSIS — I4891 Unspecified atrial fibrillation: Secondary | ICD-10-CM | POA: Diagnosis not present

## 2023-09-15 DIAGNOSIS — E785 Hyperlipidemia, unspecified: Secondary | ICD-10-CM | POA: Diagnosis not present

## 2023-09-15 LAB — LAB REPORT - SCANNED
Calcium: 9.5
EGFR: 60
TSH: 3.4 (ref 0.41–5.90)

## 2023-09-20 ENCOUNTER — Telehealth: Payer: Self-pay

## 2023-09-20 NOTE — Telephone Encounter (Signed)
Left message for pt to call back to schedule new pt visit with Dr. Tresa Endo. Advised pt to ask for Carma Lair, RN to schedule. Per Dr. Tresa Endo, pt is a family friend and he is ok with seeing her as a new pt.

## 2023-09-21 NOTE — Telephone Encounter (Signed)
Patient is returning call and is requesting call back.  

## 2023-09-24 NOTE — Telephone Encounter (Signed)
Patient is following up. She is aware RN will contact her when Dr. Tresa Endo returns to the office tomorrow for scheduling.

## 2023-09-24 NOTE — Telephone Encounter (Signed)
Pt has been scheduled to see Dr. Tresa Endo on 12/17 at 11:40am.

## 2023-09-26 DIAGNOSIS — H35362 Drusen (degenerative) of macula, left eye: Secondary | ICD-10-CM | POA: Diagnosis not present

## 2023-09-26 DIAGNOSIS — H348322 Tributary (branch) retinal vein occlusion, left eye, stable: Secondary | ICD-10-CM | POA: Diagnosis not present

## 2023-09-26 DIAGNOSIS — H43812 Vitreous degeneration, left eye: Secondary | ICD-10-CM | POA: Diagnosis not present

## 2023-09-26 DIAGNOSIS — H34831 Tributary (branch) retinal vein occlusion, right eye, with macular edema: Secondary | ICD-10-CM | POA: Diagnosis not present

## 2023-09-27 DIAGNOSIS — I6523 Occlusion and stenosis of bilateral carotid arteries: Secondary | ICD-10-CM | POA: Diagnosis not present

## 2023-09-27 DIAGNOSIS — E785 Hyperlipidemia, unspecified: Secondary | ICD-10-CM | POA: Diagnosis not present

## 2023-09-27 DIAGNOSIS — D6869 Other thrombophilia: Secondary | ICD-10-CM | POA: Diagnosis not present

## 2023-09-27 DIAGNOSIS — H353210 Exudative age-related macular degeneration, right eye, stage unspecified: Secondary | ICD-10-CM | POA: Diagnosis not present

## 2023-09-27 DIAGNOSIS — I10 Essential (primary) hypertension: Secondary | ICD-10-CM | POA: Diagnosis not present

## 2023-09-27 DIAGNOSIS — I214 Non-ST elevation (NSTEMI) myocardial infarction: Secondary | ICD-10-CM | POA: Diagnosis not present

## 2023-09-27 DIAGNOSIS — Z8673 Personal history of transient ischemic attack (TIA), and cerebral infarction without residual deficits: Secondary | ICD-10-CM | POA: Diagnosis not present

## 2023-09-27 DIAGNOSIS — I480 Paroxysmal atrial fibrillation: Secondary | ICD-10-CM | POA: Diagnosis not present

## 2023-10-02 ENCOUNTER — Encounter: Payer: Self-pay | Admitting: Cardiovascular Disease

## 2023-10-02 ENCOUNTER — Ambulatory Visit: Payer: Medicare HMO | Attending: Cardiovascular Disease | Admitting: Cardiovascular Disease

## 2023-10-02 DIAGNOSIS — D7589 Other specified diseases of blood and blood-forming organs: Secondary | ICD-10-CM | POA: Diagnosis not present

## 2023-10-02 DIAGNOSIS — I4819 Other persistent atrial fibrillation: Secondary | ICD-10-CM

## 2023-10-02 DIAGNOSIS — I6523 Occlusion and stenosis of bilateral carotid arteries: Secondary | ICD-10-CM | POA: Diagnosis not present

## 2023-10-02 DIAGNOSIS — E785 Hyperlipidemia, unspecified: Secondary | ICD-10-CM | POA: Diagnosis not present

## 2023-10-02 DIAGNOSIS — I10 Essential (primary) hypertension: Secondary | ICD-10-CM

## 2023-10-02 DIAGNOSIS — G459 Transient cerebral ischemic attack, unspecified: Secondary | ICD-10-CM

## 2023-10-02 DIAGNOSIS — D6869 Other thrombophilia: Secondary | ICD-10-CM | POA: Diagnosis not present

## 2023-10-02 MED ORDER — AMIODARONE HCL 200 MG PO TABS: ORAL_TABLET | ORAL | 3 refills | Status: DC

## 2023-10-02 NOTE — Patient Instructions (Signed)
Medication Instructions:  CONTINUE TO TAKE THE ELIQUIS 5MG  TWICE DAILY  BEGIN AMIODARONE 200MG  FOR 1 WEEK . FOR THE SECOND WEEK , TAKE 200MG  TWICE DAILY    *If you need a refill on your cardiac medications before your next appointment, please call your pharmacy*   Lab Work:  If you have labs (blood work) drawn today and your tests are completely normal, you will receive your results only by: MyChart Message (if you have MyChart) OR A paper copy in the mail If you have any lab test that is abnormal or we need to change your treatment, we will call you to review the results.   Testing/Procedures: Your physician has requested that you have an echocardiogram IN JANUARY 2025. Echocardiography is a painless test that uses sound waves to create images of your heart. It provides your doctor with information about the size and shape of your heart and how well your heart's chambers and valves are working. This procedure takes approximately one hour. There are no restrictions for this procedure. Please do NOT wear cologne, perfume, aftershave, or lotions (deodorant is allowed). Please arrive 15 minutes prior to your appointment time.  Please note: We ask at that you not bring children with you during ultrasound (echo/ vascular) testing. Due to room size and safety concerns, children are not allowed in the ultrasound rooms during exams. Our front office staff cannot provide observation of children in our lobby area while testing is being conducted. An adult accompanying a patient to their appointment will only be allowed in the ultrasound room at the discretion of the ultrasound technician under special circumstances. We apologize for any inconvenience.    Follow-Up: At [facility], you and your health needs are our priority.  As part of our continuing mission to provide you with exceptional heart care, we have created designated Provider Care Teams.  These Care Teams include your primary  Cardiologist (physician) and Advanced Practice Providers (APPs -  Physician Assistants and Nurse Practitioners) who all work together to provide you with the care you need, when you need it.  We recommend signing up for the patient portal called "MyChart".  Sign up information is provided on this After Visit Summary.  MyChart is used to connect with patients for Virtual Visits (Telemedicine).  Patients are able to view lab/test results, encounter notes, upcoming appointments, etc.  Non-urgent messages can be sent to your provider as well.   To learn more about what you can do with MyChart, go to [email or web address].    Your next appointment:   3 week(s)  Provider:   DR. Nicki Guadalajara

## 2023-10-02 NOTE — Progress Notes (Signed)
Cardiology Office Note    Date:  10/02/2023   ID:  [name], [name] [birth year], MRN [id number]  PCP:  Cleatis Polka., MD  Cardiologist:  Nicki Guadalajara, MD   New Cardiology consultation referred through the courtesy of Dr. Jenelle Mages at [facility].   History of Present Illness:  Chelsey Huff is a 82 y.o. female who is followed by Dr. Eric Form at [facility].  She was recently diagnosed with TIA and atrial fibrillation in [place] on September 14, 2083.  Upon her return to [place], she was evaluated by Dr. Eric Form and now presents for cardiology evaluation.  Chelsey Huff has a history of hyperlipidemia has been on rosuvastatin 20 mg daily.  History of fibroids, osteoporosis, as well as hypertension.  On July 07, 2023 she presented to [facility] emergency room for evaluation of leaning to her right side.  She was packing anticipation of going to [place] for the winter.  Was able to walk, denied any difficulties with vision or speech and was aware of any rhythm abnormalities.  Her symptoms lasted for 2 hours and then resolved.  She was seen in the emergency room at [facility] blood pressure initially was elevated at 157/62,  pulse 70 regular rate and rhythm on PE.  O2 saturation was 100%.  Her eye of her brain performed to assess for potential stroke.  There was no intracranial process or evidence for acute or subacute infarct.  CT angio of her head and neck did not show any acute or focal lesions to explain her symptoms.  She was noted to have 50% stenosis of the proximal left internal carotid artery relative to the more distal vessel with dense calcifications.  Atherosclerotic changes were present in the right carotid bifurcation without significant stenoses.  Minimal calcifications are present within the cavernous internal carotid arteries bilaterally without significant stenoses.  She had normal variant CTA circle of Willis without stenoses.  CT of her head  did not show any acute findings.  She was found to have mild chronic ischemic microvascular disease.  Although it was reported that EKG was not done, an EKG apparently was done at 1:348:35 did show atrial fibrillation at 80 bpm with PVC.   Chelsey. Huff and her husband had recently purchased a home in [place], [place] prior to [place].  Currently, on September 14, 2023, while she was reading, her husband noticed that her speech became slurred and she had difficulty standing up on her own and difficulty walking.  She ultimately presented to [facility] in [place], [place].  In the emergency room initial vital signs were notable for blood pressure elevation at 176/102, O2 saturation was 100%, respiratory rate 18, heart rate 81.  1 extensive evaluation.  Troponins were mildly +8.8.  Was felt to have new onset atrial fibrillation and she was started on heparin.  Cardiology was consulted, I do not have their assessment and an echo Doppler with bubble study was ordered.  Chest x-ray was normal.  She underwent extensive imaging.  Head CT was negative head CTA.  CTA of her neck showed moderate calcification with plaque in the left carotid bulb and proximal left internal carotid artery in the 50 to 69% range and mild to moderate calcific at the right carotid bulb and proximal right internal carotid artery with less than 50% stenosis.  EEG showed atrial fibrillation with PVC or aberrant complexes.  Ultimately, the patient was  started on Eliquis.  She apparently had an echo the day of discharge which is not in the report.  Upon return from [place] she was evaluated by Dr. Eric Form at [facility] on 17 2024.  Apparently she had undergone repeat laboratory and was told to add Zetia to current dose of rosuvastatin.  She now presents for cardiology consultation and evaluation.  Past Medical History:  Diagnosis Date   Atrophic vaginitis    Fibroids    Finger fracture  06/22/2018   Hypertension    Osteoporosis     Past Surgical History:  Procedure Laterality Date   ABDOMINAL HYSTERECTOMY     COLONOSCOPY WITH PROPOFOL N/A 10/26/2015   Procedure: COLONOSCOPY WITH PROPOFOL;  Surgeon: Charolett Bumpers, MD;  Location: [facility] ENDOSCOPY;  Service: Endoscopy;  Laterality: N/A;   dental implant     TOTAL ABDOMINAL HYSTERECTOMY W/ BILATERAL SALPINGOOPHORECTOMY  1999    Current Medications: Outpatient Medications Prior to Visit  Medication Sig Dispense Refill   Aflibercept (EYLEA) 2 MG/0.05ML SOLN 0.05 mL Intravitreal every 6 weeks per Dr. Allyne Gee     alendronate (FOSAMAX) 70 MG tablet TK 1 T PO ONCE WEEKLY 12 tablet 0   amoxicillin (AMOXIL) 875 MG tablet TK 1 T PO Q 12 H  0   aspirin EC 81 MG tablet Take 81 mg by mouth once.     calcium-vitamin D (OSCAL WITH D) 500-200 MG-UNIT tablet Take 1 tablet by mouth 3 (three) times daily.     Cholecalciferol (VITAMIN D) 2000 units CAPS Take 4,000 Units by mouth daily.     ELIQUIS 5 MG TABS tablet Take 5 mg by mouth 2 (two) times daily.     ESCITALOPRAM OXALATE PO Take 10 mg by mouth daily.     ezetimibe (ZETIA) 10 MG tablet Take 10 mg by mouth daily.     methotrexate (RHEUMATREX) 2.5 MG tablet Take 2.5 mg by mouth daily. Caution:Chemotherapy. Protect from light.     multivitamin (THERAGRAN) per tablet Take 1 tablet by mouth daily.       omeprazole (PRILOSEC) 20 MG capsule Take 20 mg by mouth as needed.     rosuvastatin (CRESTOR) 20 MG tablet Take 20 mg by mouth daily.     zolpidem (AMBIEN) 5 MG tablet TK 1 T PO  QHS PRF SLEEP  0   citalopram (CELEXA) 20 MG tablet TK 1 T PO QD  3   pantoprazole (PROTONIX) 40 MG tablet Take 40 mg by mouth daily.     simvastatin (ZOCOR) 20 MG tablet Take 10 mg by mouth daily.     valsartan-hydrochlorothiazide (DIOVAN-HCT) 320-25 MG per tablet Take 0.5 tablets by mouth daily.     Facility-Administered Medications Prior to Visit  Medication Dose Route Frequency Provider Last Rate Last  Admin   denosumab (PROLIA) injection 60 mg  60 mg Subcutaneous Once Gottsegen, Rande Brunt, MD         Allergies:   Codeine and Other   Social History   Socioeconomic History   Marital status: Married    Spouse name: Not on file   Number of children: Not on file   Years of education: Not on file   Highest education level: Not on file  Occupational History   Not on file  Tobacco Use   Smoking status: Never   Smokeless tobacco: Never  Vaping Use   Vaping status: Never Used  Substance and Sexual Activity   Alcohol use: Yes    Alcohol/week: 3.0 standard  drinks of alcohol    Types: 3 Glasses of wine per week   Drug use: No   Sexual activity: Yes    Birth control/protection: Post-menopausal, Surgical  Other Topics Concern   Not on file  Social History Narrative   Not on file   Social Drivers of Health   Financial Resource Strain: Not on file  Food Insecurity: Not on file  Transportation Needs: Not on file  Physical Activity: Not on file  Stress: Not on file  Social Connections: Not on file    Socially, she is married for 55 years.  Her husband is my patient.  She has 3 children and 9 grandchildren.  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  She drinks wine minimally but has been told to hold off alcohol presently on Eliquis.  She typically walks on a daily basis around 5000-7000 steps per day.  Family History:  The patient's family history includes Colon cancer in her sister; Melanoma in her son.  Her mother died at age 28.  Father died in a car accident at age 2.  A brother died at age 40 in a car accident.  Her sister died at age 43 with colon cancer.  Her 54, 50 and 48.  ROS General: Negative; No fevers, chills, or night sweats;  HEENT: Negative; No changes in vision or hearing, sinus congestion, difficulty swallowing Pulmonary: Negative; No cough, wheezing, shortness of breath, hemoptysis Cardiovascular: See HPI GI: Negative; No nausea, vomiting, diarrhea,  or abdominal pain GU: Negative; No dysuria, hematuria, or difficulty voiding Musculoskeletal: Negative; no myalgias, joint pain, or weakness Hematologic/Oncology: Negative; no easy bruising, bleeding Endocrine: Negative; no heat/cold intolerance; no diabetes Neuro: Recent transient slurred speech, TIA Skin: Negative; No rashes or skin lesions Psychiatric: Negative; No behavioral problems, depression Sleep: Negative; No snoring, daytime sleepiness, hypersomnolence, bruxism, restless legs, hypnogognic hallucinations, no cataplexy Other comprehensive 14 point system review is negative.   PHYSICAL EXAM:   VS:  BP 124/80   Pulse 84   Ht 5\' 5"  (1.651 m)   Wt 168 lb 3.2 oz (76.3 kg)   SpO2 99%   BMI 27.99 kg/m     Blood pressure by me 122/78.  Wt Readings from Last 3 Encounters:  10/02/23 168 lb 3.2 oz (76.3 kg)  07/07/23 149 lb 14.6 oz (68 kg)  09/06/18 150 lb (68 kg)    General: Alert, oriented, no distress.  Skin: normal turgor, no rashes, warm and dry HEENT: Normocephalic, atraumatic. Pupils equal round and reactive to light; sclera anicteric; extraocular muscles intact;  Nose without nasal septal hypertrophy Mouth/Parynx benign; Mallinpatti scale 2 Neck: No JVD, no carotid bruits; normal carotid upstroke Lungs: clear to ausculatation and percussion; no wheezing or rales Chest wall: without tenderness to palpitation Heart: PMI not displaced, irregular irregular with controlled rate in the 70s, s1 s2 normal, 1/6 systolic murmur, no diastolic murmur, no rubs, gallops, thrills, or heaves Abdomen: soft, nontender; no hepatosplenomehaly, BS+; abdominal aorta nontender and not dilated by palpation. Back: no CVA tenderness Pulses 2+ Musculoskeletal: full range of motion, normal strength, no joint deformities Extremities: no clubbing cyanosis or edema, Homan's sign negative  Neurologic: grossly nonfocal; Cranial nerves grossly wnl Psychologic: Normal mood and affect   Studies/Labs  Reviewed:   EKG Interpretation Date/Time:  Tuesday October 02 2023 12:42:50 EST Ventricular Rate:  84 PR Interval:    QRS Duration:  84 QT Interval:  378 QTC Calculation: 446 R Axis:   -25  Text Interpretation: Atrial  fibrillation with premature ventricular or aberrantly conducted complexes Minimal voltage criteria for LVH, may be normal variant ( R in aVL ) Nonspecific ST abnormality When compared with ECG of 07-Jul-2023 13:48, Borderline criteria for Anterior infarct are no longer Present T wave inversion no longer evident in Inferior leads Confirmed by Nicki Guadalajara ([id number]) on 10/02/2023 2:18:23 PM    Recent Labs:    Latest Ref Rng & Units 07/07/2023    2:19 PM 07/07/2023    2:09 PM 10/26/2013    6:35 PM  BMP  Glucose 70 - 99 mg/dL 914  782  956   BUN 8 - 23 mg/dL 37  36  32   Creatinine 0.44 - 1.00 mg/dL 2.13  0.86  5.78   Sodium 135 - 145 mmol/L 138  138  138   Potassium 3.5 - 5.1 mmol/L 4.2  4.2  3.9   Chloride 98 - 111 mmol/L 106  104  98   CO2 22 - 32 mmol/L  24  25   Calcium 8.9 - 10.3 mg/dL  9.8  9.5         Latest Ref Rng & Units 07/07/2023    2:09 PM  Hepatic Function  Total Protein 6.5 - 8.1 g/dL 6.8   Albumin 3.5 - 5.0 g/dL 4.1   AST 15 - 41 U/L 21   ALT 0 - 44 U/L 30   Alk Phosphatase 38 - 126 U/L 28   Total Bilirubin 0.3 - 1.2 mg/dL 1.1        Latest Ref Rng & Units 07/07/2023    2:19 PM 07/07/2023    2:09 PM 10/26/2013    6:35 PM  CBC  WBC 4.0 - 10.5 K/uL  6.5  6.0   Hemoglobin 12.0 - 15.0 g/dL 46.9  62.9  52.8   Hematocrit 36.0 - 46.0 % 42.0  43.7  38.1   Platelets 150 - 400 K/uL  197  207    Lab Results  Component Value Date   MCV 104.5 (H) 07/07/2023   MCV 97.4 10/26/2013   MCV 96.1 12/12/2008   No results found for: "TSH" No results found for: "HGBA1C"   BNP No results found for: "BNP"  ProBNP No results found for: "PROBNP"   Lipid Panel  No results found for: "CHOL", "TRIG", "HDL", "CHOLHDL", "VLDL", "LDLCALC", "LDLDIRECT",  "LABVLDL"   RADIOLOGY: No results found.   Additional studies/ records that were reviewed today include:  I extensively reviewed the records from her [facility] ER evaluation of July 07, 2023, records from [facility] in [place] from November 29 through September 15, 2023, and records of Dr. Clelia Croft at [facility].  ASSESSMENT:    1. Persistent atrial fibrillation (HCC)   2. TIA (transient ischemic attack)   3. Primary hypertension   4. Atherosclerosis of both carotid arteries   5. Hyperlipidemia with target LDL less than 70   6. Macrocytosis   7. Hypercoagulable state, secondary [facility])     PLAN:  Chelsey Huff is a very pleasant 82 year old young appearing female who has been followed by Dr. Clelia Croft with a history of hypertension, as well as hyperlipidemia.  She has been on rosuvastatin 20 mg daily.  She apparently developed a transient episode of leaning on her right side leading to ER evaluation July 07, 2023.  On presentation, her symptoms had essentially resolved and she did not have any weakness in her arms or trouble with vision or speech.  Upon presentation blood pressure  was rated at 157/62.  Physical exam says rhythm was regular with normal rate at 70 bpm.  Delay, and ECG done revealed atrial fibrillation with PVC or aberrantly conducted complexes at a rate of 80.  While in [place] at their vacation house, she developed new onset slurred speech slightly altered mental status difficulty standing up on her own leading to hospitalization at [facility] in [place].  During that evaluation she was found to be in atrial fibrillation, initially started on heparin and then subsequently treated with Eliquis.  Comprehensive cerebral, she had normal CTA of her head.  Carotid and chest imaging was performed which demonstrated moderate calcific plaque in her carotids bilaterally with a greater stenosis in the range of 50 to 69%.  Troponin was  mildly elevated.  She did not have any chest pain or shortness of breath.  Apparently an echo with bubble study was performed, but I do not have these results presently.  Back in [place], at her evaluation with Dr. Clelia Croft, she was in atrial fibrillation.  Zetia 10 mg was added to her prior dose of rosuvastatin 20 mg.  Only, the patient feels well and denies any chest pain shortness of breath awareness of palpitations, presyncope or syncope.  Her blood pressure is stable she is on Eliquis 5 mg twice a day for anticoagulation and was told also to continue to take baby aspirin 81 mg.  He has been anticoagulated since September 14, 2023.  Her exact duration of atrial fibrillation is on unknown, but her ECG from July 07, 2023 revealed atrial fibrillation for which she was completely unaware.  It is possible that she may have been in atrial fibrillation for at least 3 months or more.  This reason, I will not just initiate beta-blocker therapy.  I have elected to start amiodarone 200 mg daily for the first week then if tolerated she will increase this to 200 mg twice a day and attempt to induce pharmacologic cardioversion.  I am scheduling her for echo Doppler study to be done here in [place].  I will try to obtain the results of her most recent lipid panel done at [facility], and if LDL is greater than 70 I would increase rosuvastatin to 40 mg.  I plan to see her the week after New Year's in the office and we will add her onto my schedule for follow-up evaluation.  At that time she continues to be in atrial fibrillation, I will refer her to atrial fibrillation clinic for ultimate assessment and scheduling of outpatient DC cardioversion.  I am rechecking repeat laboratory to be obtained prior to my office visit to make certain she is tolerating amiodarone particularly with reference to chemistry profile with LFTs, as well as TSH.  Of note, I did review laboratory from July 07, 2023, and at that time she  had macrocytic red blood cells with an MCV of 104.5.  I will also check B12 and folate levels.  Will see her back in the office in several weeks for reevaluation or sooner as needed.  Medication Adjustments/Labs and Tests Ordered: Current medicines are reviewed at length with the patient today.  Concerns regarding medicines are outlined above.  Medication changes, Labs and Tests ordered today are listed in the Patient Instructions below. Patient Instructions  Medication Instructions:  CONTINUE TO TAKE THE ELIQUIS 5MG  TWICE DAILY  BEGIN AMIODARONE 200MG  FOR 1 WEEK . FOR THE SECOND WEEK , TAKE 200MG  TWICE DAILY    *If you need  a refill on your cardiac medications before your next appointment, please call your pharmacy*   Lab Work:  If you have labs (blood work) drawn today and your tests are completely normal, you will receive your results only by: MyChart Message (if you have MyChart) OR A paper copy in the mail If you have any lab test that is abnormal or we need to change your treatment, we will call you to review the results.   Testing/Procedures: Your physician has requested that you have an echocardiogram IN JANUARY 2025. Echocardiography is a painless test that uses sound waves to create images of your heart. It provides your doctor with information about the size and shape of your heart and how well your heart's chambers and valves are working. This procedure takes approximately one hour. There are no restrictions for this procedure. Please do NOT wear cologne, perfume, aftershave, or lotions (deodorant is allowed). Please arrive 15 minutes prior to your appointment time.  Please note: We ask at that you not bring children with you during ultrasound (echo/ vascular) testing. Due to room size and safety concerns, children are not allowed in the ultrasound rooms during exams. Our front office staff cannot provide observation of children in our lobby area while testing is being  conducted. An adult accompanying a patient to their appointment will only be allowed in the ultrasound room at the discretion of the ultrasound technician under special circumstances. We apologize for any inconvenience.    Follow-Up: At [facility], you and your health needs are our priority.  As part of our continuing mission to provide you with exceptional heart care, we have created designated Provider Care Teams.  These Care Teams include your primary Cardiologist (physician) and Advanced Practice Providers (APPs -  Physician Assistants and Nurse Practitioners) who all work together to provide you with the care you need, when you need it.  We recommend signing up for the patient portal called "MyChart".  Sign up information is provided on this After Visit Summary.  MyChart is used to connect with patients for Virtual Visits (Telemedicine).  Patients are able to view lab/test results, encounter notes, upcoming appointments, etc.  Non-urgent messages can be sent to your provider as well.   To learn more about what you can do with MyChart, go to [email or web address].    Your next appointment:   3 week(s)  Provider:   DR. Nicki Guadalajara      Signed, Nicki Guadalajara, MD  10/02/2023 6:01 PM    [facility] [address], [place], [place]  [address] Phone: [phone]

## 2023-10-19 DIAGNOSIS — I10 Essential (primary) hypertension: Secondary | ICD-10-CM | POA: Diagnosis not present

## 2023-10-19 LAB — COMPREHENSIVE METABOLIC PANEL
ALT: 25 [IU]/L (ref 0–32)
AST: 20 [IU]/L (ref 0–40)
Albumin: 4.4 g/dL (ref 3.8–4.8)
Alkaline Phosphatase: 29 [IU]/L — ABNORMAL LOW (ref 44–121)
BUN/Creatinine Ratio: 23 (ref 12–28)
BUN: 25 mg/dL (ref 8–27)
Bilirubin Total: 0.6 mg/dL (ref 0.0–1.2)
CO2: 25 mmol/L (ref 20–29)
Calcium: 10.1 mg/dL (ref 8.7–10.3)
Chloride: 101 mmol/L (ref 96–106)
Creatinine, Ser: 1.07 mg/dL — ABNORMAL HIGH (ref 0.57–1.00)
Globulin, Total: 1.9 g/dL (ref 1.5–4.5)
Glucose: 106 mg/dL — ABNORMAL HIGH (ref 70–99)
Potassium: 4.8 mmol/L (ref 3.5–5.2)
Sodium: 139 mmol/L (ref 134–144)
Total Protein: 6.3 g/dL (ref 6.0–8.5)
eGFR: 53 mL/min/{1.73_m2} — ABNORMAL LOW (ref >59)

## 2023-10-19 LAB — LIPID PANEL
Chol/HDL Ratio: 1.8 {ratio} (ref 0.0–4.4)
Cholesterol, Total: 151 mg/dL (ref 100–199)
HDL: 85 mg/dL (ref >39)
LDL Chol Calc (NIH): 52 mg/dL (ref 0–99)
Triglycerides: 75 mg/dL (ref 0–149)
VLDL Cholesterol Cal: 14 mg/dL (ref 5–40)

## 2023-10-19 LAB — TSH: TSH: 3.59 u[IU]/mL (ref 0.450–4.500)

## 2023-10-19 LAB — VITAMIN B12: Vitamin B-12: 726 pg/mL (ref 232–1245)

## 2023-10-22 ENCOUNTER — Ambulatory Visit (HOSPITAL_COMMUNITY): Payer: Medicare HMO | Attending: Cardiology

## 2023-10-22 DIAGNOSIS — I4819 Other persistent atrial fibrillation: Secondary | ICD-10-CM | POA: Insufficient documentation

## 2023-10-22 LAB — ECHOCARDIOGRAM COMPLETE: S' Lateral: 2.5 cm

## 2023-10-24 ENCOUNTER — Encounter: Payer: Self-pay | Admitting: Cardiovascular Disease

## 2023-10-24 ENCOUNTER — Other Ambulatory Visit: Payer: Medicare HMO

## 2023-10-24 ENCOUNTER — Ambulatory Visit: Payer: Medicare HMO | Attending: Cardiovascular Disease | Admitting: Cardiovascular Disease

## 2023-10-24 VITALS — BP 124/82 | HR 59 | Ht 65.0 in | Wt 170.8 lb

## 2023-10-24 DIAGNOSIS — I4819 Other persistent atrial fibrillation: Secondary | ICD-10-CM | POA: Diagnosis not present

## 2023-10-24 DIAGNOSIS — G459 Transient cerebral ischemic attack, unspecified: Secondary | ICD-10-CM | POA: Diagnosis not present

## 2023-10-24 DIAGNOSIS — I6523 Occlusion and stenosis of bilateral carotid arteries: Secondary | ICD-10-CM | POA: Diagnosis not present

## 2023-10-24 DIAGNOSIS — H43812 Vitreous degeneration, left eye: Secondary | ICD-10-CM | POA: Diagnosis not present

## 2023-10-24 DIAGNOSIS — H35362 Drusen (degenerative) of macula, left eye: Secondary | ICD-10-CM | POA: Diagnosis not present

## 2023-10-24 DIAGNOSIS — I10 Essential (primary) hypertension: Secondary | ICD-10-CM | POA: Diagnosis not present

## 2023-10-24 DIAGNOSIS — H34831 Tributary (branch) retinal vein occlusion, right eye, with macular edema: Secondary | ICD-10-CM | POA: Diagnosis not present

## 2023-10-24 DIAGNOSIS — E785 Hyperlipidemia, unspecified: Secondary | ICD-10-CM

## 2023-10-24 DIAGNOSIS — H348322 Tributary (branch) retinal vein occlusion, left eye, stable: Secondary | ICD-10-CM | POA: Diagnosis not present

## 2023-10-24 DIAGNOSIS — D6869 Other thrombophilia: Secondary | ICD-10-CM | POA: Diagnosis not present

## 2023-10-24 NOTE — Progress Notes (Signed)
 Cardiology Office Note    Date:  10/24/2023   ID:  Rubbie, Goostree 1940-12-31, MRN [id number]  PCP:  Loreli Elsie JONETTA Mickey., MD  Cardiologist:  Debby Sor, MD   3 week F/u cardiology evaluation initially referred through the courtesy of Dr. Elsie Georgette Loreli at [facility].   History of Present Illness:  Kinleigh Nault is a 83 y.o. female who is followed by Dr. Georgette Loreli at [facility].  She was recently diagnosed with TIA and atrial fibrillation in [place]  on September 14, 2083.  Upon her return to [place] , she was evaluated by Dr. Georgette Loreli and now presents for cardiology evaluation.  Ms Leonna Schlee has a history of hyperlipidemia has been on rosuvastatin 20 mg daily.  History of fibroids, osteoporosis, as well as hypertension.  On July 07, 2023 she presented to [facility] emergency room for evaluation of leaning to her right side.  She was packing anticipation of going to [place]  for the winter.  She was able to walk, denied any difficulties with vision or speech and was aware of any rhythm abnormalities.  Her symptoms lasted for 2 hours and then resolved.  She was seen in the emergency room at [facility] blood pressure initially was elevated at 157/62,  pulse 70 with regular rate and rhythm on PE.  O2 saturation was 100%.  CT of her brain was performed to assess for potential stroke.  There was no intracranial process or evidence for acute or subacute infarct.  CT angio of her head and neck did not show any acute or focal lesions to explain her symptoms.  She was noted to have 50% stenosis of the proximal left internal carotid artery relative to the more distal vessel with dense calcifications.  Atherosclerotic changes were present in the right carotid bifurcation without significant stenoses.  Minimal calcifications are present within the cavernous internal carotid arteries bilaterally without significant stenoses.  She had normal variant CTA circle of Willis without  stenoses.  CT of her head did not show any acute findings.  She was found to have mild chronic ischemic microvascular disease.  Although it was reported that EKG was not done, an EKG apparently was done at 1:348:35 did show atrial fibrillation at 80 bpm with PVC.   Ms. Holecek and her husband had recently purchased a home in [place], [place]  and traveled  to [place]  for vacation prior to Christmas.  On September 14, 2023, while she was reading, her husband noticed that her speech became slurred and she had difficulty standing up on her own and difficulty walking.  She ultimately presented to [facility] in [facility], [place] .  In the emergency room initial vital signs were notable for blood pressure elevation at 176/102, O2 saturation was 100%, respiratory rate 18, heart rate 81.  I extensively reviewed her evaluation.  Troponins were mildly +8.8.  She was felt to have new onset atrial fibrillation and she was started on heparin.  Cardiology was consulted, I do not have their assessment and an echo Doppler with bubble study was ordered.  Chest x-ray was normal.  She underwent extensive imaging.  Head CT was negative head CTA.  CTA of her neck showed moderate calcification with plaque in the left carotid bulb and proximal left internal carotid artery in the 50 to 69% range and mild to moderate calcific at the right carotid bulb and proximal right internal carotid artery with less than 50% stenosis.  EEG showed atrial fibrillation with  PVC or aberrant complexes.  Ultimately, the patient was started on Eliquis.  She apparently had an echo the day of discharge which is not in the report.  Upon return from [place]  she was evaluated by Dr. Georgette Gentry at [facility] on 12/12/ 2024.  Apparently she had undergone repeat laboratory and was told to add Zetia to current dose of rosuvastatin.  I saw her for my initial cardiology consultation on October 02, 2023.  At that time, her blood pressure was  stable at 122/78.  Rhythm was irregularly irregular with controlled atrial fibrillation rate in the 70s.  ECG confirmed atrial fibrillation with ventricular rate at 84 with premature ventricular aberrantly conducted complexes.  She was on Eliquis 5 mg twice a day and baby aspirin.  With the exact duration of her atrial fibrillation unknown, was concerned that she may have been in atrial fibrillation for several months.  I recommended initiation of amiodarone  200 mg daily for the first week then recommended increasing this to 200 mg twice a day and attempt to induce potential pharmacologic cardioversion.  I scheduled her for follow-up echo Doppler study.  I also recommended follow-up laboratory after the new year.  On October 18, 2022, laboratory showed total cholesterol 151, HDL 85, LDL 52 and triglycerides 75 on her regimen now consisting of Zetia 10 mg and rosuvastatin 20 mg.  TSH was normal.  LFTs were normal.  LDL cholesterol was now 52 with total cholesterol 151 triglycerides 75 and HDL 85.  On October 22, 2023 an echo Doppler study showed EF at 60 to 65%.  There was felt to be severe LA dilation with moderate RA dilation.  There was mild to moderate mitral regurgitation.  There was mild calcification of a trileaflet aortic valve suggesting mild sclerosis without stenosis.  There was trivial AR.  Presently, she feels well with her resting heart rate in the 60s.  She denies chest pain or shortness of breath.  She continues to be on amiodarone  200 mg twice a day, aspirin 81 mg Eliquis 5 mg twice a day.  She denies bleeding.  She is tolerating rosuvastatin/Zetia.  She presents for reevaluation.   Past Medical History:  Diagnosis Date   Atrophic vaginitis    Fibroids    Finger fracture 06/22/2018   Hypertension    Osteoporosis     Past Surgical History:  Procedure Laterality Date   ABDOMINAL HYSTERECTOMY     COLONOSCOPY WITH PROPOFOL  N/A 10/26/2015   Procedure: COLONOSCOPY WITH PROPOFOL ;  Surgeon:  Gladis MARLA Louder, MD;  Location: [facility] ENDOSCOPY;  Service: Endoscopy;  Laterality: N/A;   dental implant     TOTAL ABDOMINAL HYSTERECTOMY W/ BILATERAL SALPINGOOPHORECTOMY  1999    Current Medications: Outpatient Medications Prior to Visit  Medication Sig Dispense Refill   Aflibercept  (EYLEA ) 2 MG/0.05ML SOLN 0.05 mL Intravitreal every 6 weeks per Dr. Jarold     alendronate  (FOSAMAX ) 70 MG tablet TK 1 T PO ONCE WEEKLY 12 tablet 0   amiodarone  (PACERONE ) 200 MG tablet TAKE 200MG  DAILY FOR ONE WEEK. FOR THE SECOND WEEK TAKE 200MG  TWICE DAILY 90 tablet 3   amoxicillin (AMOXIL) 875 MG tablet TK 1 T PO Q 12 H  0   aspirin EC 81 MG tablet Take 81 mg by mouth once.     calcium-vitamin D  (OSCAL WITH D) 500-200 MG-UNIT tablet Take 1 tablet by mouth 3 (three) times daily.     Cholecalciferol (VITAMIN D ) 2000 units CAPS Take 4,000 Units by mouth daily.  ELIQUIS 5 MG TABS tablet Take 5 mg by mouth 2 (two) times daily.     ESCITALOPRAM OXALATE PO Take 10 mg by mouth daily.     ezetimibe (ZETIA) 10 MG tablet Take 10 mg by mouth daily.     methotrexate (RHEUMATREX) 2.5 MG tablet Take 2.5 mg by mouth daily. Caution:Chemotherapy. Protect from light.     multivitamin (THERAGRAN) per tablet Take 1 tablet by mouth daily.       omeprazole (PRILOSEC) 20 MG capsule Take 20 mg by mouth as needed.     rosuvastatin (CRESTOR) 20 MG tablet Take 20 mg by mouth daily.     zolpidem (AMBIEN) 5 MG tablet TK 1 T PO  QHS PRF SLEEP  0   Facility-Administered Medications Prior to Visit  Medication Dose Route Frequency Provider Last Rate Last Admin   denosumab  (PROLIA ) injection 60 mg  60 mg Subcutaneous Once Gottsegen, Toribio CROME, MD         Allergies:   Codeine and Other   Social History   Socioeconomic History   Marital status: Married    Spouse name: Not on file   Number of children: Not on file   Years of education: Not on file   Highest education level: Not on file  Occupational History   Not on file   Tobacco Use   Smoking status: Never   Smokeless tobacco: Never  Vaping Use   Vaping status: Never Used  Substance and Sexual Activity   Alcohol  use: Yes    Alcohol /week: 3.0 standard drinks of alcohol     Types: 3 Glasses of wine per week   Drug use: No   Sexual activity: Yes    Birth control/protection: Post-menopausal, Surgical  Other Topics Concern   Not on file  Social History Narrative   Not on file   Social Drivers of Health   Financial Resource Strain: Not on file  Food Insecurity: Not on file  Transportation Needs: Not on file  Physical Activity: Not on file  Stress: Not on file  Social Connections: Not on file    Socially, she is married for 55 years.  Her husband is my patient.  She has 3 children and 9 grandchildren.  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  She drinks wine minimally but has been told to hold off alcohol  presently on Eliquis.  She typically walks on a daily basis around 5000-7000 steps per day.  Family History:  The patient's family history includes Colon cancer in her sister; Melanoma in her son.  Her mother died at age 17.  Father died in a car accident at age 81.  A brother died at age 42 in a car accident.  Her sister died at age 87 with colon cancer.  Her 54, 50 and 48.  ROS General: Negative; No fevers, chills, or night sweats;  HEENT: Negative; No changes in vision or hearing, sinus congestion, difficulty swallowing Pulmonary: Negative; No cough, wheezing, shortness of breath, hemoptysis Cardiovascular: See HPI GI: Negative; No nausea, vomiting, diarrhea, or abdominal pain GU: Negative; No dysuria, hematuria, or difficulty voiding Musculoskeletal: Negative; no myalgias, joint pain, or weakness Hematologic/Oncology: Negative; no easy bruising, bleeding Endocrine: Negative; no heat/cold intolerance; no diabetes Neuro: Recent transient slurred speech, TIA Skin: Negative; No rashes or skin lesions Psychiatric: Negative; No  behavioral problems, depression Sleep: Negative; No snoring, daytime sleepiness, hypersomnolence, bruxism, restless legs, hypnogognic hallucinations, no cataplexy Other comprehensive 14 point system review is negative.   PHYSICAL EXAM:  VS:  BP 124/82   Pulse (!) 59   Ht 5' 5 (1.651 m)   Wt 170 lb 12.8 oz (77.5 kg)   SpO2 95%   BMI 28.42 kg/m     Blood pressure by me 120/80.  Wt Readings from Last 3 Encounters:  10/24/23 170 lb 12.8 oz (77.5 kg)  10/02/23 168 lb 3.2 oz (76.3 kg)  07/07/23 149 lb 14.6 oz (68 kg)    General: Alert, oriented, no distress.  Skin: normal turgor, no rashes, warm and dry HEENT: Normocephalic, atraumatic. Pupils equal round and reactive to light; sclera anicteric; extraocular muscles intact;  Nose without nasal septal hypertrophy Mouth/Parynx benign; Mallinpatti scale 2 Neck: No JVD, no carotid bruits; normal carotid upstroke Lungs: clear to ausculatation and percussion; no wheezing or rales Chest wall: without tenderness to palpitation Heart: PMI not displaced, irregular irregular with controlled rate in the 60s, s1 s2 normal, 1/6 systolic murmur, no diastolic murmur, no rubs, gallops, thrills, or heaves Abdomen: soft, nontender; no hepatosplenomehaly, BS+; abdominal aorta nontender and not dilated by palpation. Back: no CVA tenderness Pulses 2+ Musculoskeletal: full range of motion, normal strength, no joint deformities Extremities: no clubbing cyanosis or edema, Homan's sign negative  Neurologic: grossly nonfocal; Cranial nerves grossly wnl Psychologic: Normal mood and affect   Studies/Labs Reviewed:   EKG Interpretation Date/Time:  Wednesday October 24 2023 11:42:07 EST Ventricular Rate:  59 PR Interval:    QRS Duration:  96 QT Interval:  412 QTC Calculation: 407 R Axis:   -28  Text Interpretation: Atrial fibrillation with slow ventricular response When compared with ECG of 02-Oct-2023 12:42, No significant change was found Confirmed  by Burnard Ned ([id number]) on 10/24/2023 3:28:52 PM    October 02, 2023 ECG (independently read by me): Atrial fibrillation with premature ventricular or aberrantly conducted complexes, ventricular rate 84 bpm.  QTc 4 4 6  ms.  Recent Labs:    Latest Ref Rng & Units 10/19/2023    9:35 AM 07/07/2023    2:19 PM 07/07/2023    2:09 PM  BMP  Glucose 70 - 99 mg/dL 893  899  896   BUN 8 - 27 mg/dL 25  37  36   Creatinine 0.57 - 1.00 mg/dL 8.92  8.99  8.91   BUN/Creat Ratio 12 - 28 23     Sodium 134 - 144 mmol/L 139  138  138   Potassium 3.5 - 5.2 mmol/L 4.8  4.2  4.2   Chloride 96 - 106 mmol/L 101  106  104   CO2 20 - 29 mmol/L 25   24   Calcium 8.7 - 10.3 mg/dL 89.8   9.8         Latest Ref Rng & Units 10/19/2023    9:35 AM 07/07/2023    2:09 PM  Hepatic Function  Total Protein 6.0 - 8.5 g/dL 6.3  6.8   Albumin 3.8 - 4.8 g/dL 4.4  4.1   AST 0 - 40 IU/L 20  21   ALT 0 - 32 IU/L 25  30   Alk Phosphatase 44 - 121 IU/L 29  28   Total Bilirubin 0.0 - 1.2 mg/dL 0.6  1.1        Latest Ref Rng & Units 07/07/2023    2:19 PM 07/07/2023    2:09 PM 10/26/2013    6:35 PM  CBC  WBC 4.0 - 10.5 K/uL  6.5  6.0   Hemoglobin 12.0 - 15.0 g/dL 85.6  14.3  13.0   Hematocrit 36.0 - 46.0 % 42.0  43.7  38.1   Platelets 150 - 400 K/uL  197  207    Lab Results  Component Value Date   MCV 104.5 (H) 07/07/2023   MCV 97.4 10/26/2013   MCV 96.1 12/12/2008   Lab Results  Component Value Date   TSH 3.590 10/19/2023   No results found for: HGBA1C   BNP No results found for: BNP  ProBNP No results found for: PROBNP   Lipid Panel     Component Value Date/Time   CHOL 151 10/19/2023 0935   TRIG 75 10/19/2023 0935   HDL 85 10/19/2023 0935   CHOLHDL 1.8 10/19/2023 0935   LDLCALC 52 10/19/2023 0935   LABVLDL 14 10/19/2023 0935     RADIOLOGY: ECHOCARDIOGRAM COMPLETE Result Date: 10/22/2023    ECHOCARDIOGRAM REPORT   Patient Name:   Courteney Alderete Date of Exam: 10/22/2023 Medical Rec #:   [id number]           Height:       65.0 in Accession #:    [id number]          Weight:       168.2 lb Date of Birth:  08/06/1941           BSA:          1.838 m Patient Age:    82 years            BP:           124/80 mmHg Patient Gender: F                   HR:           55 bpm. Exam Location:  [address] Procedure: 2D Echo, Cardiac Doppler and Color Doppler Indications:    I48.91* Unspecified atrial fibrillation  History:        Patient has no prior history of Echocardiogram examinations.                 TIA; Risk Factors:Hypertension and Dyslipidemia.  Sonographer:    Jon Hacker RCS Referring Phys: DEBBY DELENA SOR IMPRESSIONS  1. Left ventricular ejection fraction, by estimation, is 60 to 65%. The left ventricle has normal function. The left ventricle has no regional wall motion abnormalities. Left ventricular diastolic function could not be evaluated.  2. Right ventricular systolic function is normal. The right ventricular size is normal.  3. Left atrial size was severely dilated.  4. Right atrial size was moderately dilated.  5. The mitral valve is normal in structure. Mild to moderate mitral valve regurgitation. No evidence of mitral stenosis.  6. The aortic valve is normal in structure. There is mild calcification of the aortic valve. Aortic valve regurgitation is trivial. Aortic valve sclerosis/calcification is present, without any evidence of aortic stenosis.  7. The inferior vena cava is normal in size with greater than 50% respiratory variability, suggesting right atrial pressure of 3 mmHg. FINDINGS  Left Ventricle: Left ventricular ejection fraction, by estimation, is 60 to 65%. The left ventricle has normal function. The left ventricle has no regional wall motion abnormalities. The left ventricular internal cavity size was normal in size. There is  no left ventricular hypertrophy. Left ventricular diastolic function could not be evaluated due to atrial fibrillation. Left ventricular diastolic  function could not be evaluated. Right Ventricle: The right ventricular size is normal. No increase in right ventricular wall thickness. Right ventricular systolic function  is normal. Left Atrium: Left atrial size was severely dilated. Right Atrium: Right atrial size was moderately dilated. Pericardium: There is no evidence of pericardial effusion. Mitral Valve: The mitral valve is normal in structure. Mild to moderate mitral valve regurgitation. No evidence of mitral valve stenosis. Tricuspid Valve: The tricuspid valve is normal in structure. Tricuspid valve regurgitation is not demonstrated. No evidence of tricuspid stenosis. Aortic Valve: The aortic valve is normal in structure. There is mild calcification of the aortic valve. Aortic valve regurgitation is trivial. Aortic valve sclerosis/calcification is present, without any evidence of aortic stenosis. Pulmonic Valve: The pulmonic valve was grossly normal. Pulmonic valve regurgitation is mild. No evidence of pulmonic stenosis. Aorta: The aortic root is normal in size and structure. Venous: The inferior vena cava is normal in size with greater than 50% respiratory variability, suggesting right atrial pressure of 3 mmHg. IAS/Shunts: No atrial level shunt detected by color flow Doppler.  LEFT VENTRICLE PLAX 2D LVIDd:         3.80 cm LVIDs:         2.50 cm LV PW:         0.70 cm LV IVS:        1.00 cm LVOT diam:     2.00 cm LV SV:         72 LV SV Index:   39 LVOT Area:     3.14 cm  RIGHT VENTRICLE RV Basal diam:  2.80 cm RV S prime:     10.73 cm/s TAPSE (M-mode): 1.3 cm RVSP:           22.0 mmHg LEFT ATRIUM             Index        RIGHT ATRIUM           Index LA diam:        5.20 cm 2.83 cm/m   RA Pressure: 3.00 mmHg LA Vol (A2C):   74.3 ml 40.43 ml/m  RA Area:     16.70 cm LA Vol (A4C):   83.0 ml 45.16 ml/m  RA Volume:   39.90 ml  21.71 ml/m LA Biplane Vol: 79.9 ml 43.48 ml/m  AORTIC VALVE             PULMONIC VALVE LVOT Vmax:   100.60 cm/s PR End Diast  Vel: 4.75 msec LVOT Vmean:  62.233 cm/s LVOT VTI:    0.229 m  AORTA Ao Root diam: 2.60 cm Ao Asc diam:  3.10 cm TRICUSPID VALVE TR Peak grad:   19.0 mmHg TR Vmax:        218.00 cm/s Estimated RAP:  3.00 mmHg RVSP:           22.0 mmHg  SHUNTS Systemic VTI:  0.23 m Systemic Diam: 2.00 cm Toribio Fuel MD Electronically signed by Toribio Fuel MD Signature Date/Time: 10/22/2023/12:19:23 PM    Final      Additional studies/ records that were reviewed today include:  I extensively reviewed the records from her [facility] ER evaluation of July 07, 2023, records from [facility] in [place]  from November 29 through September 15, 2023, and records of Dr. Loreli at [facility].  ASSESSMENT:    1. Persistent atrial fibrillation (HCC)   2. TIA (transient ischemic attack)   3. Primary hypertension   4. Atherosclerosis of both carotid arteries   5. Hyperlipidemia with target LDL less than 70   6. Hypercoagulable state, secondary (HCC)    PLAN:  Dawnetta Copenhaver is a very pleasant 83 year old young appearing female who has been followed by Dr. Loreli with a history of hypertension, as well as hyperlipidemia.  She has been on rosuvastatin 20 mg daily.  She apparently developed a transient episode of leaning on her right side leading to ER evaluation July 07, 2023.  On presentation, her symptoms had essentially resolved and she did not have any weakness in her arms or trouble with vision or speech.  Upon presentation blood pressure was rated at 157/62.  Physical exam says rhythm was regular with normal rate at 70 bpm.   ECG done revealed atrial fibrillation with PVC or aberrantly conducted complexes at a rate of 80.  While in [place]  at their vacation house, she developed new onset slurred speech slightly altered mental status difficulty standing up on her own leading to hospitalization at [facility] in [facility] [place] .  During that evaluation she was found to be in  atrial fibrillation, initially started on heparin and then subsequently treated with Eliquis.   She had normal CTA of her head.  Carotid and chest imaging was performed which demonstrated moderate calcific plaque in her carotids bilaterally with a greater stenosis in the range of 50 to 69%.  Troponin was mildly elevated.  She did not have any chest pain or shortness of breath.  Apparently an echo with bubble study was performed, but I do not have these results presently.  Back in [place], at her evaluation with Dr. Loreli, she was in atrial fibrillation.  Zetia 10 mg was added to her prior dose of rosuvastatin 20 mg.  Only, the patient feels well and denies any chest pain shortness of breath awareness of palpitations, presyncope or syncope.  Her blood pressure is stable she is on Eliquis 5 mg twice a day for anticoagulation and was told also to continue to take baby aspirin 81 mg.  He has been anticoagulated since September 14, 2023.  Her exact duration of atrial fibrillation is unknown, but her ECG from July 07, 2023 revealed atrial fibrillation for which she was completely unaware although on PE she was felt to have normal regular rhythm.  It is possible that she may have been in atrial fibrillation for several months.  She initial evaluation she was started on amiodarone  200 mg for the first week and subsequently has been on 200 mg twice a day.  She is doing well with therapy and is unaware of any heart rate irregularity.  ECG today confirms atrial fibrillation with rate at 59 bpm.  She is anticoagulated on Eliquis and is on low-dose aspirin.  Laboratory has shown normal LFTs and thyroid  function.  On combination rosuvastatin and Zetia, LDL cholesterol is 52 on October 19, 2023.  With her still being in atrial fibrillation, I am referring her to the A-fib clinic to be seen later next week or the week thereafter with plans for her to undergo outpatient DC cardioversion if she does not pharmacologically  convert.  Reviewed her most recent echo Doppler findings which show normal LV function with EF 60 to 65%.  There is biatrial enlargement, severe LA with moderate RA dilatation.  B12 level is normal at 726 with her previously noted macrocytosis.  I will see her in follow-up of her cardioversion.  Medication Adjustments/Labs and Tests Ordered: Current medicines are reviewed at length with the patient today.  Concerns regarding medicines are outlined above.  Medication changes, Labs and Tests ordered today are listed in the Patient Instructions below.  Patient Instructions  Medication Instructions:  *If you need a refill on your cardiac medications before your next appointment, please call your pharmacy*   Lab Work: If you have labs (blood work) drawn today and your tests are completely normal, you will receive your results only by: MyChart Message (if you have MyChart) OR A paper copy in the mail If you have any lab test that is abnormal or we need to change your treatment, we will call you to review the results.    Follow-Up: At [facility], you and your health needs are our priority.  As part of our continuing mission to provide you with exceptional heart care, we have created designated Provider Care Teams.  These Care Teams include your primary Cardiologist (physician) and Advanced Practice Providers (APPs -  Physician Assistants and Nurse Practitioners) who all work together to provide you with the care you need, when you need it.  We recommend signing up for the patient portal called MyChart.  Sign up information is provided on this After Visit Summary.  MyChart is used to connect with patients for Virtual Visits (Telemedicine).  Patients are able to view lab/test results, encounter notes, upcoming appointments, etc.  Non-urgent messages can be sent to your provider as well.   To learn more about what you can do with MyChart, go to [email or web address].    Your next  appointment:   1 month(s)   Provider:   Dr. Debby Sor     Other Instructions Thank you for choosing [facility]!       Signed, Debby Sor, MD  10/24/2023 3:40 PM    [facility] [address], [place], [place]  [address] Phone: [phone]

## 2023-10-24 NOTE — Patient Instructions (Addendum)
 Medication Instructions:  *If you need a refill on your cardiac medications before your next appointment, please call your pharmacy*   Lab Work: If you have labs (blood work) drawn today and your tests are completely normal, you will receive your results only by: MyChart Message (if you have MyChart) OR A paper copy in the mail If you have any lab test that is abnormal or we need to change your treatment, we will call you to review the results.    Follow-Up: At [facility], you and your health needs are our priority.  As part of our continuing mission to provide you with exceptional heart care, we have created designated Provider Care Teams.  These Care Teams include your primary Cardiologist (physician) and Advanced Practice Providers (APPs -  Physician Assistants and Nurse Practitioners) who all work together to provide you with the care you need, when you need it.  We recommend signing up for the patient portal called MyChart.  Sign up information is provided on this After Visit Summary.  MyChart is used to connect with patients for Virtual Visits (Telemedicine).  Patients are able to view lab/test results, encounter notes, upcoming appointments, etc.  Non-urgent messages can be sent to your provider as well.   To learn more about what you can do with MyChart, go to [email or web address].    Your next appointment:   1 month(s)   Provider:   Dr. Debby Sor     Other Instructions Thank you for choosing [facility]!

## 2023-10-26 ENCOUNTER — Ambulatory Visit: Payer: Medicare HMO | Admitting: Cardiovascular Disease

## 2023-10-31 DIAGNOSIS — Z1231 Encounter for screening mammogram for malignant neoplasm of breast: Secondary | ICD-10-CM | POA: Diagnosis not present

## 2023-11-05 ENCOUNTER — Ambulatory Visit (HOSPITAL_COMMUNITY)
Admission: RE | Admit: 2023-11-05 | Discharge: 2023-11-05 | Disposition: A | Discharge status: Home / Self Care | Payer: Medicare HMO | Source: Ambulatory Visit | Attending: Internal Medicine | Admitting: Internal Medicine

## 2023-11-05 VITALS — BP 190/100 | HR 75 | Ht 65.0 in | Wt 169.6 lb

## 2023-11-05 DIAGNOSIS — I6529 Occlusion and stenosis of unspecified carotid artery: Secondary | ICD-10-CM | POA: Diagnosis not present

## 2023-11-05 DIAGNOSIS — Z5181 Encounter for therapeutic drug level monitoring: Secondary | ICD-10-CM | POA: Insufficient documentation

## 2023-11-05 DIAGNOSIS — Z79899 Other long term (current) drug therapy: Secondary | ICD-10-CM | POA: Diagnosis not present

## 2023-11-05 DIAGNOSIS — Z7901 Long term (current) use of anticoagulants: Secondary | ICD-10-CM | POA: Diagnosis not present

## 2023-11-05 DIAGNOSIS — E785 Hyperlipidemia, unspecified: Secondary | ICD-10-CM | POA: Diagnosis not present

## 2023-11-05 DIAGNOSIS — I4819 Other persistent atrial fibrillation: Secondary | ICD-10-CM | POA: Insufficient documentation

## 2023-11-05 DIAGNOSIS — I4892 Unspecified atrial flutter: Secondary | ICD-10-CM | POA: Insufficient documentation

## 2023-11-05 DIAGNOSIS — D6869 Other thrombophilia: Secondary | ICD-10-CM | POA: Insufficient documentation

## 2023-11-05 DIAGNOSIS — I484 Atypical atrial flutter: Secondary | ICD-10-CM | POA: Insufficient documentation

## 2023-11-05 DIAGNOSIS — I10 Essential (primary) hypertension: Secondary | ICD-10-CM | POA: Insufficient documentation

## 2023-11-05 DIAGNOSIS — Z8673 Personal history of transient ischemic attack (TIA), and cerebral infarction without residual deficits: Secondary | ICD-10-CM | POA: Diagnosis not present

## 2023-11-05 LAB — CBC
HCT: 42 % (ref 36.0–46.0)
Hemoglobin: 13.6 g/dL (ref 12.0–15.0)
MCH: 32.9 pg (ref 26.0–34.0)
MCHC: 32.4 g/dL (ref 30.0–36.0)
MCV: 101.4 fL — ABNORMAL HIGH (ref 80.0–100.0)
Platelets: 179 10*3/uL (ref 150–400)
RBC: 4.14 MIL/uL (ref 3.87–5.11)
RDW: 12.3 % (ref 11.5–15.5)
WBC: 4 10*3/uL (ref 4.0–10.5)
nRBC: 0 % (ref 0.0–0.2)

## 2023-11-05 MED ORDER — AMIODARONE HCL 200 MG PO TABS: 200.0000 mg | ORAL_TABLET | Freq: Every day | ORAL | 1 refills | Status: DC

## 2023-11-05 NOTE — Patient Instructions (Addendum)
Reduce amiodarone to 200mg  ONCE a day starting Jan 21st.  Cardioversion scheduled for: Monday, January 27th   - Arrive at the [facility] and go to admitting at 815AM   - Do not eat or drink anything after midnight the night prior to your procedure.   - Take all your morning medication (except diabetic medications) with a sip of water prior to arrival.  - You will not be able to drive home after your procedure.    - Do NOT miss any doses of your blood thinner - if you should miss a dose please notify our office immediately.   - If you feel as if you go back into normal rhythm prior to scheduled cardioversion, please notify our office immediately.   If your procedure is canceled in the cardioversion suite you will be charged a cancellation fee.

## 2023-11-05 NOTE — Progress Notes (Signed)
Primary Care Physician: Cleatis Polka., MD Primary Cardiologist: Dr. Tresa Endo Electrophysiologist: None     Referring Physician: Dr. Wilford Corner Chelsey Huff is a 83 y.o. female with a history of TIA, HTN, carotid artery stenosis, HLD and paroxysmal atrial fibrillation who presents for consultation in the [facility]. Seen by Dr. Tresa Endo on 10/02/23 and started on amiodarone load for rate controlled Afib (concern for her being out of rhythm for months). Seen on 10/24/23 still noted to be in Afib. Patient is on Eliquis 5 mg BID for a CHADS2VASC score of 6.  On evaluation today, she is currently atrial flutter. She does not appear to have cardiac awareness of her arrhythmia. She began amiodarone 200 mg once daily when seen by Dr. Tresa Endo in December and then one week later increased to amiodarone 200 mg twice daily. She has remained on amiodarone 200 mg twice daily since then. No missed doses of Eliquis.    Today, she denies symptoms of palpitations, chest pain, shortness of breath, orthopnea, PND, lower extremity edema, dizziness, presyncope, syncope, snoring, daytime somnolence, bleeding, or neurologic sequela. The patient is tolerating medications without difficulties and is otherwise without complaint today.    she has a BMI of Body mass index is 28.22 kg/m.Marland Kitchen Filed Weights   11/05/23 1050  Weight: 76.9 kg    Current Outpatient Medications  Medication Sig Dispense Refill   Aflibercept (EYLEA) 2 MG/0.05ML SOLN 0.05 mL Intravitreal every 6 weeks per Dr. Allyne Gee     alendronate (FOSAMAX) 70 MG tablet TK 1 T PO ONCE WEEKLY 12 tablet 0   aspirin EC 81 MG tablet Take 81 mg by mouth once.     calcium-vitamin D (OSCAL WITH D) 500-200 MG-UNIT tablet Take 1 tablet by mouth 3 (three) times daily.     Cholecalciferol (VITAMIN D) 2000 units CAPS Take 4,000 Units by mouth daily.     ELIQUIS 5 MG TABS tablet Take 5 mg by mouth 2 (two) times daily.     ESCITALOPRAM  OXALATE PO Take 10 mg by mouth daily.     ezetimibe (ZETIA) 10 MG tablet Take 10 mg by mouth daily.     multivitamin (THERAGRAN) per tablet Take 1 tablet by mouth daily.       omeprazole (PRILOSEC) 20 MG capsule Take 10 mg by mouth as needed.     rosuvastatin (CRESTOR) 20 MG tablet Take 20 mg by mouth daily.     amiodarone (PACERONE) 200 MG tablet Take 1 tablet (200 mg total) by mouth daily. 90 tablet 1   zolpidem (AMBIEN) 5 MG tablet TK 1 T PO  QHS PRF SLEEP (Patient not taking: Reported on 11/05/2023)  0   Current Facility-Administered Medications  Medication Dose Route Frequency Provider Last Rate Last Admin   denosumab (PROLIA) injection 60 mg  60 mg Subcutaneous Once Trellis Paganini, MD        Atrial Fibrillation Management history:  Previous antiarrhythmic drugs: amiodarone Previous cardioversions: none Previous ablations: none Anticoagulation history: Eliquis 5 mg BID   ROS- All systems are reviewed and negative except as per the HPI above.  Physical Exam: BP (!) 190/100   Pulse 75   Ht 5\' 5"  (1.651 m)   Wt 76.9 kg   BMI 28.22 kg/m   GEN: Well nourished, well developed in no acute distress NECK: No JVD; No carotid bruits CARDIAC: Irregularly irregular rate and rhythm, no murmurs, rubs, gallops RESPIRATORY:  Clear to  auscultation without rales, wheezing or rhonchi  ABDOMEN: Soft, non-tender, non-distended EXTREMITIES:  No edema; No deformity   EKG today demonstrates  Vent. rate 75 BPM PR interval * ms QRS duration 96 ms QT/QTcB 370/413 ms P-R-T axes * -22 25 Undetermined rhythm Cannot rule out Anterior infarct , age undetermined Abnormal ECG When compared with ECG of 24-Oct-2023 11:42, PREVIOUS ECG IS PRESENT  Echo 10/22/23 demonstrated   1. Left ventricular ejection fraction, by estimation, is 60 to 65%. The  left ventricle has normal function. The left ventricle has no regional  wall motion abnormalities. Left ventricular diastolic function could not  be  evaluated.   2. Right ventricular systolic function is normal. The right ventricular  size is normal.   3. Left atrial size was severely dilated.   4. Right atrial size was moderately dilated.   5. The mitral valve is normal in structure. Mild to moderate mitral valve  regurgitation. No evidence of mitral stenosis.   6. The aortic valve is normal in structure. There is mild calcification  of the aortic valve. Aortic valve regurgitation is trivial. Aortic valve  sclerosis/calcification is present, without any evidence of aortic  stenosis.   7. The inferior vena cava is normal in size with greater than 50%  respiratory variability, suggesting right atrial pressure of 3 mmHg.   ASSESSMENT & PLAN CHA2DS2-VASc Score = 6  The patient's score is based upon: CHF History: 0 HTN History: 1 Diabetes History: 0 Stroke History: 2 Vascular Disease History: 0 Age Score: 2 Gender Score: 1       ASSESSMENT AND PLAN: Persistent Atrial Fibrillation (ICD10:  I48.19) / atrial flutter The patient's CHA2DS2-VASc score is 6, indicating a 9.7% annual risk of stroke.    She is currently in atrial flutter. She remains on amiodarone 200 mg twice daily. After discussion of treatment options, patient is in agreement to proceed with cardioversion to try to convert to NSR. We did briefly discuss other treatment options such as ablation for long term rhythm control management. Labs drawn today.  Informed Consent   Shared Decision Making/Informed Consent The risks (stroke, cardiac arrhythmias rarely resulting in the need for a temporary or permanent pacemaker, skin irritation or burns and complications associated with conscious sedation including aspiration, arrhythmia, respiratory failure and death), benefits (restoration of normal sinus rhythm) and alternatives of a direct current cardioversion were explained in detail to Chelsey Huff and she agrees to proceed.       High risk medication monitoring (ICD10:  R7229428) Patient requires ongoing monitoring for anti-arrhythmic medication which has the potential to cause life threatening arrhythmias or AV block. Recommendation to transition to amiodarone 200 mg once daily. It appears patient has completed an amiodarone load of 200 mg BID x ~1 month.    Secondary Hypercoagulable State (ICD10:  D68.69) The patient is at significant risk for stroke/thromboembolism based upon her CHA2DS2-VASc Score of 6.  Continue Apixaban (Eliquis).  No missed doses.       Follow up as scheduled with Dr. Tresa Endo post-DCCV.    Lake Bells, PA-C  [facility] [address] [place], [place] [address] [phone]

## 2023-11-05 NOTE — H&P (View-Only) (Signed)
Primary Care Physician: Chelsey Huff., MD Primary Cardiologist: Dr. Tresa Endo Electrophysiologist: None     Referring Physician: Dr. Wilford Corner Chelsey Huff is a 83 y.o. female with a history of TIA, HTN, carotid artery stenosis, HLD and paroxysmal atrial fibrillation who presents for consultation in the [facility]. Seen by Dr. Tresa Endo on 10/02/23 and started on amiodarone load for rate controlled Afib (concern for her being out of rhythm for months). Seen on 10/24/23 still noted to be in Afib. Patient is on Eliquis 5 mg BID for a CHADS2VASC score of 6.  On evaluation today, she is currently atrial flutter. She does not appear to have cardiac awareness of her arrhythmia. She began amiodarone 200 mg once daily when seen by Dr. Tresa Endo in December and then one week later increased to amiodarone 200 mg twice daily. She has remained on amiodarone 200 mg twice daily since then. No missed doses of Eliquis.    Today, she denies symptoms of palpitations, chest pain, shortness of breath, orthopnea, PND, lower extremity edema, dizziness, presyncope, syncope, snoring, daytime somnolence, bleeding, or neurologic sequela. The patient is tolerating medications without difficulties and is otherwise without complaint today.    she has a BMI of Body mass index is 28.22 kg/m.Marland Kitchen Filed Weights   11/05/23 1050  Weight: 76.9 kg    Current Outpatient Medications  Medication Sig Dispense Refill   Aflibercept (EYLEA) 2 MG/0.05ML SOLN 0.05 mL Intravitreal every 6 weeks per Dr. Allyne Gee     alendronate (FOSAMAX) 70 MG tablet TK 1 T PO ONCE WEEKLY 12 tablet 0   aspirin EC 81 MG tablet Take 81 mg by mouth once.     calcium-vitamin D (OSCAL WITH D) 500-200 MG-UNIT tablet Take 1 tablet by mouth 3 (three) times daily.     Cholecalciferol (VITAMIN D) 2000 units CAPS Take 4,000 Units by mouth daily.     ELIQUIS 5 MG TABS tablet Take 5 mg by mouth 2 (two) times daily.     ESCITALOPRAM  OXALATE PO Take 10 mg by mouth daily.     ezetimibe (ZETIA) 10 MG tablet Take 10 mg by mouth daily.     multivitamin (THERAGRAN) per tablet Take 1 tablet by mouth daily.       omeprazole (PRILOSEC) 20 MG capsule Take 10 mg by mouth as needed.     rosuvastatin (CRESTOR) 20 MG tablet Take 20 mg by mouth daily.     amiodarone (PACERONE) 200 MG tablet Take 1 tablet (200 mg total) by mouth daily. 90 tablet 1   zolpidem (AMBIEN) 5 MG tablet TK 1 T PO  QHS PRF SLEEP (Patient not taking: Reported on 11/05/2023)  0   Current Facility-Administered Medications  Medication Dose Route Frequency Provider Last Rate Last Admin   denosumab (PROLIA) injection 60 mg  60 mg Subcutaneous Once Trellis Paganini, MD        Atrial Fibrillation Management history:  Previous antiarrhythmic drugs: amiodarone Previous cardioversions: none Previous ablations: none Anticoagulation history: Eliquis 5 mg BID   ROS- All systems are reviewed and negative except as per the HPI above.  Physical Exam: BP (!) 190/100   Pulse 75   Ht 5\' 5"  (1.651 m)   Wt 76.9 kg   BMI 28.22 kg/m   GEN: Well nourished, well developed in no acute distress NECK: No JVD; No carotid bruits CARDIAC: Irregularly irregular rate and rhythm, no murmurs, rubs, gallops RESPIRATORY:  Clear to  auscultation without rales, wheezing or rhonchi  ABDOMEN: Soft, non-tender, non-distended EXTREMITIES:  No edema; No deformity   EKG today demonstrates  Vent. rate 75 BPM PR interval * ms QRS duration 96 ms QT/QTcB 370/413 ms P-R-T axes * -22 25 Undetermined rhythm Cannot rule out Anterior infarct , age undetermined Abnormal ECG When compared with ECG of 24-Oct-2023 11:42, PREVIOUS ECG IS PRESENT  Echo 10/22/23 demonstrated   1. Left ventricular ejection fraction, by estimation, is 60 to 65%. The  left ventricle has normal function. The left ventricle has no regional  wall motion abnormalities. Left ventricular diastolic function could not  be  evaluated.   2. Right ventricular systolic function is normal. The right ventricular  size is normal.   3. Left atrial size was severely dilated.   4. Right atrial size was moderately dilated.   5. The mitral valve is normal in structure. Mild to moderate mitral valve  regurgitation. No evidence of mitral stenosis.   6. The aortic valve is normal in structure. There is mild calcification  of the aortic valve. Aortic valve regurgitation is trivial. Aortic valve  sclerosis/calcification is present, without any evidence of aortic  stenosis.   7. The inferior vena cava is normal in size with greater than 50%  respiratory variability, suggesting right atrial pressure of 3 mmHg.   ASSESSMENT & PLAN CHA2DS2-VASc Score = 6  The patient's score is based upon: CHF History: 0 HTN History: 1 Diabetes History: 0 Stroke History: 2 Vascular Disease History: 0 Age Score: 2 Gender Score: 1       ASSESSMENT AND PLAN: Persistent Atrial Fibrillation (ICD10:  I48.19) / atrial flutter The patient's CHA2DS2-VASc score is 6, indicating a 9.7% annual risk of stroke.    She is currently in atrial flutter. She remains on amiodarone 200 mg twice daily. After discussion of treatment options, patient is in agreement to proceed with cardioversion to try to convert to NSR. We did briefly discuss other treatment options such as ablation for long term rhythm control management. Labs drawn today.  Informed Consent   Shared Decision Making/Informed Consent The risks (stroke, cardiac arrhythmias rarely resulting in the need for a temporary or permanent pacemaker, skin irritation or burns and complications associated with conscious sedation including aspiration, arrhythmia, respiratory failure and death), benefits (restoration of normal sinus rhythm) and alternatives of a direct current cardioversion were explained in detail to Chelsey Huff and she agrees to proceed.       High risk medication monitoring (ICD10:  R7229428) Patient requires ongoing monitoring for anti-arrhythmic medication which has the potential to cause life threatening arrhythmias or AV block. Recommendation to transition to amiodarone 200 mg once daily. It appears patient has completed an amiodarone load of 200 mg BID x ~1 month.    Secondary Hypercoagulable State (ICD10:  D68.69) The patient is at significant risk for stroke/thromboembolism based upon her CHA2DS2-VASc Score of 6.  Continue Apixaban (Eliquis).  No missed doses.       Follow up as scheduled with Dr. Tresa Endo post-DCCV.    Lake Bells, PA-C  [facility] [address] [place], [place] [address] [phone]

## 2023-11-07 ENCOUNTER — Ambulatory Visit (HOSPITAL_COMMUNITY): Payer: Medicare HMO | Admitting: Internal Medicine

## 2023-11-08 DIAGNOSIS — N6311 Unspecified lump in the right breast, upper outer quadrant: Secondary | ICD-10-CM | POA: Diagnosis not present

## 2023-11-09 NOTE — Progress Notes (Signed)
Spoke to patient and instructed them to come at 0745  and to be NPO after 0000.  Medications reviewed.    Confirmed that patient will have a ride home and someone to stay with them for 24 hours after the procedure.

## 2023-11-12 ENCOUNTER — Ambulatory Visit (HOSPITAL_COMMUNITY): Payer: Medicare HMO | Admitting: Anesthesiology

## 2023-11-12 ENCOUNTER — Other Ambulatory Visit: Payer: Self-pay

## 2023-11-12 ENCOUNTER — Ambulatory Visit (HOSPITAL_BASED_OUTPATIENT_CLINIC_OR_DEPARTMENT_OTHER): Payer: Medicare HMO | Admitting: Anesthesiology

## 2023-11-12 ENCOUNTER — Encounter (HOSPITAL_COMMUNITY): Payer: Self-pay | Admitting: Cardiology

## 2023-11-12 ENCOUNTER — Encounter (HOSPITAL_COMMUNITY)
Admission: RE | Disposition: A | Discharge status: Home / Self Care | Payer: Self-pay | Source: Home / Self Care | Attending: Cardiology

## 2023-11-12 ENCOUNTER — Ambulatory Visit (HOSPITAL_COMMUNITY)
Admission: RE | Admit: 2023-11-12 | Discharge: 2023-11-12 | Disposition: A | Discharge status: Home / Self Care | Payer: Medicare HMO | Attending: Cardiology | Admitting: Cardiology

## 2023-11-12 DIAGNOSIS — Z8673 Personal history of transient ischemic attack (TIA), and cerebral infarction without residual deficits: Secondary | ICD-10-CM | POA: Insufficient documentation

## 2023-11-12 DIAGNOSIS — I4891 Unspecified atrial fibrillation: Secondary | ICD-10-CM

## 2023-11-12 DIAGNOSIS — I4892 Unspecified atrial flutter: Secondary | ICD-10-CM | POA: Insufficient documentation

## 2023-11-12 DIAGNOSIS — Z7901 Long term (current) use of anticoagulants: Secondary | ICD-10-CM | POA: Insufficient documentation

## 2023-11-12 DIAGNOSIS — I10 Essential (primary) hypertension: Secondary | ICD-10-CM | POA: Diagnosis not present

## 2023-11-12 DIAGNOSIS — Z79899 Other long term (current) drug therapy: Secondary | ICD-10-CM | POA: Diagnosis not present

## 2023-11-12 DIAGNOSIS — E785 Hyperlipidemia, unspecified: Secondary | ICD-10-CM | POA: Insufficient documentation

## 2023-11-12 DIAGNOSIS — D6869 Other thrombophilia: Secondary | ICD-10-CM | POA: Diagnosis not present

## 2023-11-12 DIAGNOSIS — I4819 Other persistent atrial fibrillation: Secondary | ICD-10-CM | POA: Insufficient documentation

## 2023-11-12 HISTORY — PX: CARDIOVERSION: EP1203

## 2023-11-12 SURGERY — CARDIOVERSION (CATH LAB)
Anesthesia: General

## 2023-11-12 MED ORDER — SODIUM CHLORIDE 0.9% FLUSH: 3.0000 mL | Freq: Two times a day (BID) | INTRAVENOUS | Status: DC

## 2023-11-12 MED ORDER — PROPOFOL 10 MG/ML IV BOLUS
INTRAVENOUS | Status: DC | PRN
  Administered 2023-11-12: 50 mg via INTRAVENOUS

## 2023-11-12 MED ORDER — SODIUM CHLORIDE 0.9% FLUSH: 3.0000 mL | INTRAVENOUS | Status: DC | PRN

## 2023-11-12 SURGICAL SUPPLY — 1 items: PAD DEFIB RADIO PHYSIO CONN (PAD) ×1 IMPLANT

## 2023-11-12 NOTE — Anesthesia Postprocedure Evaluation (Signed)
Anesthesia Post Note  Patient: Chelsey Huff  Procedure(s) Performed: CARDIOVERSION     Patient location during evaluation: Cath Lab Anesthesia Type: General Level of consciousness: awake and alert Pain management: pain level controlled Vital Signs Assessment: post-procedure vital signs reviewed and stable Respiratory status: spontaneous breathing, nonlabored ventilation and respiratory function stable Cardiovascular status: blood pressure returned to baseline and stable Postop Assessment: no apparent nausea or vomiting Anesthetic complications: no   No notable events documented.  Last Vitals:  Vitals:   11/12/23 0913 11/12/23 0925  BP: (!) 142/58 (!) 156/60  Pulse: (!) 50 (!) 52  Resp: 18 18  Temp: 36.6 C   SpO2: 99% 99%    Last Pain:  Vitals:   11/12/23 0925  TempSrc:   PainSc: 0-No pain                 Collene Schlichter

## 2023-11-12 NOTE — CV Procedure (Signed)
   Electrical Cardioversion Procedure Note Chelsey Huff [id number] 1941-09-02  Procedure: Electrical Cardioversion Indications:  Atrial Fibrillation  Time Out: Verified patient identification, verified procedure,medications/allergies/relevent history reviewed, required imaging and test results available.  Performed  Procedure Details  The patient signed informed consent.   The patient was NPO past midnight. Has had therapeutic anticoagulation with Eliquis  greater than 3 weeks. The patient denies any interruption of anticoagulation.  Anesthesia was administered by Dr. Desmond Lope.  Adequate airway was maintained throughout and vital followed per protocol.  He was cardioverted x 1 with 200J of biphasic synchronized energy.  He converted to NSR.  There were no apparent complications.  The patient tolerated the procedure well and had normal neuro status and respiratory status post procedure with vitals stable as recorded elsewhere.     IMPRESSION:  Successful cardioversion of atrial fibrillation   Follow up:  We will arrange follow up with primary cardiologist.  He will continue on current medical therapy.  The patient advised to continue anticoagulation.  Chelsey Huff 11/12/2023, 9:12 AM

## 2023-11-12 NOTE — Transfer of Care (Signed)
Immediate Anesthesia Transfer of Care Note  Patient: Chelsey Huff  Procedure(s) Performed: CARDIOVERSION  Patient Location: PACU and Short Stay  Anesthesia Type:MAC  Level of Consciousness: awake, alert , and oriented  Airway & Oxygen Therapy: Patient Spontanous Breathing  Post-op Assessment: Report given to RN  Post vital signs: Reviewed and stable  Last Vitals:  Vitals Value Taken Time  BP 178/96 11/12/23 0858  Temp    Pulse 74 11/12/23 0858  Resp 18 11/12/23 0858  SpO2 96 % 11/12/23 0858  Vitals shown include unfiled device data.  Last Pain:  Vitals:   11/12/23 0752  TempSrc: Temporal         Complications: No notable events documented.

## 2023-11-12 NOTE — Interval H&P Note (Signed)
History and Physical Interval Note:  11/12/2023 8:35 AM  Chelsey Huff  has presented today for surgery, with the diagnosis of AFIB.  The various methods of treatment have been discussed with the patient and family. After consideration of risks, benefits and other options for treatment, the patient has consented to  Procedure(s): CARDIOVERSION (N/A) as a surgical intervention.  The patient's history has been reviewed, patient examined, no change in status, stable for surgery.  I have reviewed the patient's chart and labs.  Questions were answered to the patient's satisfaction.     [name]

## 2023-11-12 NOTE — Anesthesia Procedure Notes (Signed)
Procedure Name: MAC Date/Time: 11/12/2023 8:55 AM  Performed by: Bartholomew Crews, CRNAPre-anesthesia Checklist: Patient identified, Timeout performed, Emergency Drugs available, Suction available and Patient being monitored Patient Re-evaluated:Patient Re-evaluated prior to induction Induction Type: IV induction Placement Confirmation: positive ETCO2 Dental Injury: Teeth and Oropharynx as per pre-operative assessment

## 2023-11-12 NOTE — Anesthesia Preprocedure Evaluation (Addendum)
Anesthesia Evaluation  Patient identified by MRN, date of birth, ID band Patient awake    Reviewed: Allergy & Precautions, NPO status , Patient's Chart, lab work & pertinent test results  Airway Mallampati: II  TM Distance: >3 FB Neck ROM: Full    Dental  (+) Teeth Intact, Dental Advisory Given   Pulmonary neg pulmonary ROS   Pulmonary exam normal breath sounds clear to auscultation       Cardiovascular hypertension, Pt. on medications + dysrhythmias Atrial Fibrillation  Rhythm:Irregular Rate:Abnormal     Neuro/Psych negative neurological ROS     GI/Hepatic Neg liver ROS,GERD  Medicated,,  Endo/Other  negative endocrine ROS    Renal/GU negative Renal ROS     Musculoskeletal  (+) Arthritis ,    Abdominal   Peds  Hematology  (+) Blood dyscrasia (Eliquis)   Anesthesia Other Findings   Reproductive/Obstetrics                             Anesthesia Physical Anesthesia Plan  ASA: 3  Anesthesia Plan: General   Post-op Pain Management: Minimal or no pain anticipated   Induction: Intravenous  PONV Risk Score and Plan: 3 and TIVA  Airway Management Planned: Natural Airway and Mask  Additional Equipment:   Intra-op Plan:   Post-operative Plan:   Informed Consent: I have reviewed the patients History and Physical, chart, labs and discussed the procedure including the risks, benefits and alternatives for the proposed anesthesia with the patient or authorized representative who has indicated his/her understanding and acceptance.     Dental advisory given  Plan Discussed with: CRNA  Anesthesia Plan Comments:        Anesthesia Quick Evaluation

## 2023-11-20 DIAGNOSIS — M25561 Pain in right knee: Secondary | ICD-10-CM | POA: Diagnosis not present

## 2023-11-21 DIAGNOSIS — H34831 Tributary (branch) retinal vein occlusion, right eye, with macular edema: Secondary | ICD-10-CM | POA: Diagnosis not present

## 2023-11-21 DIAGNOSIS — H35362 Drusen (degenerative) of macula, left eye: Secondary | ICD-10-CM | POA: Diagnosis not present

## 2023-11-21 DIAGNOSIS — H43812 Vitreous degeneration, left eye: Secondary | ICD-10-CM | POA: Diagnosis not present

## 2023-11-21 DIAGNOSIS — H348322 Tributary (branch) retinal vein occlusion, left eye, stable: Secondary | ICD-10-CM | POA: Diagnosis not present

## 2023-11-22 ENCOUNTER — Ambulatory Visit: Payer: Medicare HMO | Attending: Cardiovascular Disease | Admitting: Cardiovascular Disease

## 2023-11-22 ENCOUNTER — Encounter: Payer: Self-pay | Admitting: Cardiovascular Disease

## 2023-11-22 VITALS — BP 180/84 | HR 57 | Ht 65.0 in | Wt 166.4 lb

## 2023-11-22 DIAGNOSIS — D6869 Other thrombophilia: Secondary | ICD-10-CM

## 2023-11-22 DIAGNOSIS — I4891 Unspecified atrial fibrillation: Secondary | ICD-10-CM

## 2023-11-22 DIAGNOSIS — I10 Essential (primary) hypertension: Secondary | ICD-10-CM | POA: Diagnosis not present

## 2023-11-22 DIAGNOSIS — E785 Hyperlipidemia, unspecified: Secondary | ICD-10-CM

## 2023-11-22 DIAGNOSIS — I4819 Other persistent atrial fibrillation: Secondary | ICD-10-CM | POA: Diagnosis not present

## 2023-11-22 NOTE — Patient Instructions (Signed)
 Medication Instructions:  No medication changes were made during today's visit  *If you need a refill on your cardiac medications before your next appointment, please call your pharmacy*   Lab Work: No labs were ordered during today's visit.  If you have labs (blood work) drawn today and your tests are completely normal, you will receive your results only by: MyChart Message (if you have MyChart) OR A paper copy in the mail If you have any lab test that is abnormal or we need to change your treatment, we will call you to review the results.   Testing/Procedures: No procedures ordered today.    Follow-Up: At [facility], you and your health needs are our priority.  As part of our continuing mission to provide you with exceptional heart care, we have created designated Provider Care Teams.  These Care Teams include your primary Cardiologist (physician) and Advanced Practice Providers (APPs -  Physician Assistants and Nurse Practitioners) who all work together to provide you with the care you need, when you need it.  We recommend signing up for the patient portal called MyChart.  Sign up information is provided on this After Visit Summary.  MyChart is used to connect with patients for Virtual Visits (Telemedicine).  Patients are able to view lab/test results, encounter notes, upcoming appointments, etc.  Non-urgent messages can be sent to your provider as well.   To learn more about what you can do with MyChart, go to [email or web address].    Your next appointment:   3 month(s)  Provider:   Dr. Debby Sor    Other Instructions Thank you for choosing [facility]!

## 2023-11-22 NOTE — Progress Notes (Signed)
 Cardiology Office Note    Date:  11/23/2023   ID:  [name], [name] Feb 10, 1941, MRN [id number]  PCP:  Chelsey Huff., MD  Cardiologist:  Chelsey Sor, MD   4 week F/u cardiology evaluation initially referred through the courtesy of Dr. Elsie Chelsey Huff at [facility].   History of Present Illness:  Chelsey Huff is a 83 y.o. female who is followed by Dr. Georgette Chelsey at [facility].  She was recently diagnosed with TIA and atrial fibrillation in [place]  on September 14, 2083.  Upon her return to [place] , she was evaluated by Dr. Georgette Chelsey and presented for cardiology evaluation.  Chelsey Huff has a history of hyperlipidemia has been on rosuvastatin 20 mg daily.  History of fibroids, osteoporosis, as well as hypertension.  On July 07, 2023 she presented to [facility] emergency room for evaluation of leaning to her right side.  She was packing anticipation of going to [place]  for the winter.  She was able to walk, denied any difficulties with vision or speech and was aware of any rhythm abnormalities.  Her symptoms lasted for 2 hours and then resolved.  She was seen in the emergency room at [facility] blood pressure initially was elevated at 157/62,  pulse 70 with regular rate and rhythm on PE.  O2 saturation was 100%.  CT of her brain was performed to assess for potential stroke.  There was no intracranial process or evidence for acute or subacute infarct.  CT angio of her head and neck did not show any acute or focal lesions to explain her symptoms.  She was noted to have 50% stenosis of the proximal left internal carotid artery relative to the more distal vessel with dense calcifications.  Atherosclerotic changes were present in the right carotid bifurcation without significant stenoses.  Minimal calcifications are present within the cavernous internal carotid arteries bilaterally without significant stenoses.  She had normal variant CTA circle of Willis without stenoses.   CT of her head did not show any acute findings.  She was found to have mild chronic ischemic microvascular disease.  Although it was reported that EKG was not done, an EKG apparently was done at 1:348:35 did show atrial fibrillation at 80 bpm with PVC.   Chelsey. Huff and her husband had recently purchased a home in [place], [place]  and traveled  to [place]  for vacation prior to Christmas.  On September 14, 2023, while she was reading, her husband noticed that her speech became slurred and she had difficulty standing up on her own and difficulty walking.  She ultimately presented to [facility] in [facility], [place] .  In the emergency room initial vital signs were notable for blood pressure elevation at 176/102, O2 saturation was 100%, respiratory rate 18, heart rate 81.  I extensively reviewed her evaluation.  Troponins were mildly +8.8.  She was felt to have new onset atrial fibrillation and she was started on heparin.  Cardiology was consulted, I do not have their assessment and an echo Doppler with bubble study was ordered.  Chest x-ray was normal.  She underwent extensive imaging.  Head CT was negative head CTA.  CTA of her neck showed moderate calcification with plaque in the left carotid bulb and proximal left internal carotid artery in the 50 to 69% range and mild to moderate calcific at the right carotid bulb and proximal right internal carotid artery with less than 50% stenosis.  EEG showed atrial fibrillation with PVC  or aberrant complexes.  Ultimately, the patient was started on Eliquis.  She apparently had an echo the day of discharge which is not in the report.  Upon return from [place]  she was evaluated by Dr. Georgette Huff at [facility] on 12/12/ 2024.  Apparently she had undergone repeat laboratory and was told to add Zetia to current dose of rosuvastatin.  I saw her for my initial cardiology consultation on October 02, 2023.  At that time, her blood pressure was stable at  122/78.  Rhythm was irregularly irregular with controlled atrial fibrillation rate in the 70s.  ECG confirmed atrial fibrillation with ventricular rate at 84 with premature ventricular aberrantly conducted complexes.  She was on Eliquis 5 mg twice a day and baby aspirin.  With the exact duration of her atrial fibrillation unknown, was concerned that she may have been in atrial fibrillation for several months.  I recommended initiation of amiodarone  200 mg daily for the first week then recommended increasing this to 200 mg twice a day and attempt to induce potential pharmacologic cardioversion.  I scheduled her for follow-up echo Doppler study.  I also recommended follow-up laboratory after the new year.  On October 18, 2022, laboratory showed total cholesterol 151, HDL 85, LDL 52 and triglycerides 75 on her regimen now consisting of Zetia 10 mg and rosuvastatin 20 mg.  TSH was normal.  LFTs were normal.  LDL cholesterol was now 52 with total cholesterol 151 triglycerides 75 and HDL 85.  On October 22, 2023 an echo Doppler study showed EF at 60 to 65%.  There was felt to be severe LA dilation with moderate RA dilation.  There was mild to moderate mitral regurgitation.  There was mild calcification of a trileaflet aortic valve suggesting mild sclerosis without stenosis.  There was trivial AR.  I last saw her on October 24, 2023 at which time she felt well remained in atrial fibrillation with controlled rate in the 60s.    She denies chest pain or shortness of breath.  She continues to be on amiodarone  200 mg twice a day, aspirin 81 mg Eliquis 5 mg twice a day.  She denies bleeding.  She is tolerating rosuvastatin/Zetia.  During that evaluation I recommended she be seen in the atrial fibrillation clinic and scheduled for outpatient DC cardioversion.  He was evaluated by Chelsey Heinrich, PA-C in the A-fib clinic on November 05, 2023.  He was scheduled to undergo cardioversion and this was successfully done on November 12, 2023 by Dr. Charlton top with restoration of sinus rhythm following 1 shock at 200 J of biphasic synchronized energy.  Subsequently, Chelsey. Huff has continued to feel well.  He states her blood pressures at home have been in the 1 24-1 32 range with diastolics in the mid 70s.  He is unaware of any recurrent atrial fibrillation.  She and her husband plan to go back to [place]  next week if she remains stable.  She continues to be now on amiodarone  at just 200 mg daily in addition to aspirin 81 and Eliquis 5 mg twice a day, and is on Zetia 10 mg and rosuvastatin 20 mg daily for hyperlipidemia.  With her cardioversion she admits to having more energy and is less tired.  She has been in contact with her physician from [place]  who will see her sometime in March for follow-up evaluation.    Past Medical History:  Diagnosis Date   Atrophic vaginitis    Fibroids    Finger  fracture 06/22/2018   Hypertension    Osteoporosis     Past Surgical History:  Procedure Laterality Date   ABDOMINAL HYSTERECTOMY     CARDIOVERSION N/A 11/12/2023   Procedure: CARDIOVERSION;  Surgeon: Sheena Pugh, DO;  Location: [facility] INVASIVE CV LAB;  Service: Cardiovascular;  Laterality: N/A;   COLONOSCOPY WITH PROPOFOL  N/A 10/26/2015   Procedure: COLONOSCOPY WITH PROPOFOL ;  Surgeon: Gladis MARLA Louder, MD;  Location: [facility] ENDOSCOPY;  Service: Endoscopy;  Laterality: N/A;   dental implant     TOTAL ABDOMINAL HYSTERECTOMY W/ BILATERAL SALPINGOOPHORECTOMY  1999    Current Medications: Outpatient Medications Prior to Visit  Medication Sig Dispense Refill   Aflibercept  (EYLEA ) 2 MG/0.05ML SOLN 0.05 mL Intravitreal every 6 weeks per Dr. Jarold     amiodarone  (PACERONE ) 200 MG tablet Take 1 tablet (200 mg total) by mouth daily. 90 tablet 1   aspirin EC 81 MG tablet Take 81 mg by mouth daily.     calcium-vitamin D  (OSCAL WITH D) 500-200 MG-UNIT tablet Take 1 tablet by mouth 3 (three) times daily.     denosumab  (PROLIA ) 60 MG/ML SOSY  injection Inject 60 mg into the skin every 6 (six) months.     ELIQUIS 5 MG TABS tablet Take 5 mg by mouth 2 (two) times daily.     escitalopram (LEXAPRO) 10 MG tablet Take 10 mg by mouth daily.     ezetimibe (ZETIA) 10 MG tablet Take 10 mg by mouth daily.     multivitamin (THERAGRAN) per tablet Take 1 tablet by mouth daily.       omeprazole (PRILOSEC) 20 MG capsule Take 20 mg by mouth daily as needed (acid reflux).     rosuvastatin (CRESTOR) 20 MG tablet Take 20 mg by mouth daily.     Facility-Administered Medications Prior to Visit  Medication Dose Route Frequency Provider Last Rate Last Admin   denosumab  (PROLIA ) injection 60 mg  60 mg Subcutaneous Once Gottsegen, Toribio CROME, MD         Allergies:   Codeine and Other   Social History   Socioeconomic History   Marital status: Married    Spouse name: Not on file   Number of children: Not on file   Years of education: Not on file   Highest education level: Not on file  Occupational History   Not on file  Tobacco Use   Smoking status: Never   Smokeless tobacco: Never  Vaping Use   Vaping status: Never Used  Substance and Sexual Activity   Alcohol  use: Yes    Alcohol /week: 3.0 standard drinks of alcohol     Types: 3 Glasses of wine per week   Drug use: No   Sexual activity: Yes    Birth control/protection: Post-menopausal, Surgical  Other Topics Concern   Not on file  Social History Narrative   Not on file   Social Drivers of Health   Financial Resource Strain: Not on file  Food Insecurity: Not on file  Transportation Needs: Not on file  Physical Activity: Not on file  Stress: Not on file  Social Connections: Not on file    Socially, she is married for 55 years.  Her husband is my patient.  She has 3 children and 9 grandchildren.  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  She drinks wine minimally but has been told to hold off alcohol  presently on Eliquis.  She typically walks on a daily basis around 5000-7000  steps per day.  Family History:  The patient's family history includes Colon cancer in her sister; Melanoma in her son.  Her mother died at age 7.  Father died in a car accident at age 104.  A brother died at age 83 in a car accident.  Her sister died at age 61 with colon cancer.  Her 54, 50 and 48.  ROS General: Negative; No fevers, chills, or night sweats;  HEENT: Negative; No changes in vision or hearing, sinus congestion, difficulty swallowing Pulmonary: Negative; No cough, wheezing, shortness of breath, hemoptysis Cardiovascular: See HPI GI: Negative; No nausea, vomiting, diarrhea, or abdominal pain GU: Negative; No dysuria, hematuria, or difficulty voiding Musculoskeletal: Negative; no myalgias, joint pain, or weakness Hematologic/Oncology: Negative; no easy bruising, bleeding Endocrine: Negative; no heat/cold intolerance; no diabetes Neuro: Recent transient slurred speech, TIA Skin: Negative; No rashes or skin lesions Psychiatric: Negative; No behavioral problems, depression Sleep: Negative; No snoring, daytime sleepiness, hypersomnolence, bruxism, restless legs, hypnogognic hallucinations, no cataplexy Other comprehensive 14 point system review is negative.   PHYSICAL EXAM:   VS:  BP (!) 180/84   Pulse (!) 57   Ht 5' 5 (1.651 m)   Wt 166 lb 6.4 oz (75.5 kg)   SpO2 96%   BMI 27.69 kg/m     Blood pressure by me 158/78.  The patient admits at times to have an anxiety when coming to the doctor's office leading to blood pressure lability.  Wt Readings from Last 3 Encounters:  11/22/23 166 lb 6.4 oz (75.5 kg)  11/12/23 160 lb (72.6 kg)  11/05/23 169 lb 9.6 oz (76.9 kg)    General: Alert, oriented, no distress.  Skin: normal turgor, no rashes, warm and dry HEENT: Normocephalic, atraumatic. Pupils equal round and reactive to light; sclera anicteric; extraocular muscles intact;  Nose without nasal septal hypertrophy Mouth/Parynx benign; Mallinpatti scale 2 Neck: No JVD, no  carotid bruits; normal carotid upstroke Lungs: clear to ausculatation and percussion; no wheezing or rales Chest wall: without tenderness to palpitation Heart: PMI not displaced, RRR, s1 s2 normal, 1/6 systolic murmur, no diastolic murmur, no rubs, gallops, thrills, or heaves Abdomen: soft, nontender; no hepatosplenomehaly, BS+; abdominal aorta nontender and not dilated by palpation. Back: no CVA tenderness Pulses 2+ Musculoskeletal: full range of motion, normal strength, no joint deformities Extremities: no clubbing cyanosis or edema, Homan's sign negative  Neurologic: grossly nonfocal; Cranial nerves grossly wnl Psychologic: Normal mood and affect     Studies/Labs Reviewed:   EKG Interpretation Date/Time:  Thursday November 22 2023 09:38:20 EST Ventricular Rate:  57 PR Interval:  234 QRS Duration:  104 QT Interval:  420 QTC Calculation: 408 R Axis:   -24  Text Interpretation: Sinus bradycardia with 1st degree A-V block Moderate voltage criteria for LVH, may be normal variant ( R in aVL , Cornell product ) When compared with ECG of 12-Nov-2023 09:11, No significant change was found Confirmed by Burnard Ned ([id number]) on 11/23/2023 5:08:45 PM    October 02, 2023 ECG (independently read by me): Atrial fibrillation with premature ventricular or aberrantly conducted complexes, ventricular rate 84 bpm.  QTc 4 4 6  Chelsey.  Recent Labs:    Latest Ref Rng & Units 10/19/2023    9:35 AM 07/07/2023    2:19 PM 07/07/2023    2:09 PM  BMP  Glucose 70 - 99 mg/dL 893  899  896   BUN 8 - 27 mg/dL 25  37  36   Creatinine 0.57 - 1.00 mg/dL 8.92  8.99  8.91  BUN/Creat Ratio 12 - 28 23     Sodium 134 - 144 mmol/L 139  138  138   Potassium 3.5 - 5.2 mmol/L 4.8  4.2  4.2   Chloride 96 - 106 mmol/L 101  106  104   CO2 20 - 29 mmol/L 25   24   Calcium 8.7 - 10.3 mg/dL 89.8   9.8         Latest Ref Rng & Units 10/19/2023    9:35 AM 07/07/2023    2:09 PM  Hepatic Function  Total Protein 6.0 - 8.5 g/dL  6.3  6.8   Albumin 3.8 - 4.8 g/dL 4.4  4.1   AST 0 - 40 IU/L 20  21   ALT 0 - 32 IU/L 25  30   Alk Phosphatase 44 - 121 IU/L 29  28   Total Bilirubin 0.0 - 1.2 mg/dL 0.6  1.1        Latest Ref Rng & Units 11/05/2023   11:38 AM 07/07/2023    2:19 PM 07/07/2023    2:09 PM  CBC  WBC 4.0 - 10.5 K/uL 4.0   6.5   Hemoglobin 12.0 - 15.0 g/dL 86.3  85.6  85.6   Hematocrit 36.0 - 46.0 % 42.0  42.0  43.7   Platelets 150 - 400 K/uL 179   197    Lab Results  Component Value Date   MCV 101.4 (H) 11/05/2023   MCV 104.5 (H) 07/07/2023   MCV 97.4 10/26/2013   Lab Results  Component Value Date   TSH 3.590 10/19/2023   No results found for: HGBA1C   BNP No results found for: BNP  ProBNP No results found for: PROBNP   Lipid Panel     Component Value Date/Time   CHOL 151 10/19/2023 0935   TRIG 75 10/19/2023 0935   HDL 85 10/19/2023 0935   CHOLHDL 1.8 10/19/2023 0935   LDLCALC 52 10/19/2023 0935   LABVLDL 14 10/19/2023 0935     RADIOLOGY: EP STUDY Result Date: 11/12/2023 See surgical note for result.    Additional studies/ records that were reviewed today include:  I extensively reviewed the records from her [facility] ER evaluation of July 07, 2023, records from [facility] in [place]  from November 29 through September 15, 2023, and records of Dr. Loreli at [facility].  ASSESSMENT:    1. Primary hypertension   2. Persistent atrial fibrillation (HCC)   3. Atrial fibrillation status postsuccessful cardioversion [facility]); November 12, 2023   4. Hyperlipidemia with target LDL less than 70   5. Hypercoagulable state, secondary [facility] [place] [facility])    PLAN:  Chelsey Huff is a very pleasant 83 year old young appearing female who has been followed by Dr. Loreli with a history of hypertension, as well as hyperlipidemia.  She has been on rosuvastatin 20 mg daily.  She apparently developed a transient episode of leaning on her right side leading to ER evaluation  July 07, 2023.  On presentation, her symptoms had essentially resolved and she did not have any weakness in her arms or trouble with vision or speech.  Upon presentation blood pressure was rated at 157/62.  Physical exam says rhythm was regular with normal rate at 70 bpm.   ECG done revealed atrial fibrillation with PVC or aberrantly conducted complexes at a rate of 80.  While in [place]  at their vacation house, she developed new onset slurred speech slightly altered mental status difficulty standing up on her own leading  to hospitalization at [facility] in [facility] [place] .  During that evaluation she was found to be in atrial fibrillation, initially started on heparin and then subsequently treated with Eliquis.   She had normal CTA of her head.  Carotid and chest imaging was performed which demonstrated moderate calcific plaque in her carotids bilaterally with a greater stenosis in the range of 50 to 69%.  Troponin was mildly elevated.  She did not have any chest pain or shortness of breath.  Apparently an echo with bubble study was performed, but I do not have these results presently.  Back in [place], at her evaluation with Dr. Loreli, she was in atrial fibrillation.  Zetia 10 mg was added to her prior dose of rosuvastatin 20 mg.  Only, the patient feels well and denies any chest pain shortness of breath awareness of palpitations, presyncope or syncope.  Her blood pressure is stable she is on Eliquis 5 mg twice a day for anticoagulation and was told also to continue to take baby aspirin 81 mg.  He has been anticoagulated since September 14, 2023.  Her exact duration of atrial fibrillation is unknown, but her ECG from July 07, 2023 revealed atrial fibrillation for which she was completely unaware although on PE she was felt to have normal regular rhythm.  It is possible that she may have been in atrial fibrillation for several months.  She my initial evaluation she was started on amiodarone   200 mg for the first week and subsequently has been on 200 mg twice a day.  She is doing well with therapy and is unaware of any heart rate irregularity.  ECG today confirms atrial fibrillation with rate at 59 bpm.  She is anticoagulated on Eliquis and is on low-dose aspirin.  Laboratory has shown normal LFTs and thyroid  function.  On combination rosuvastatin and Zetia, LDL cholesterol is 52 on October 19, 2023.  Since my prior evaluation, she has successfully undergone DC cardioversion on November 12, 2023 and has been maintaining sinus rhythm.  Her ECG today shows sinus bradycardia at 57 bpm with mild first-degree AV block with PR interval of 234 Chelsey.  She and her husband are planning to return back to [place]  next week.  Presently I have recommended she continue her current dose of amiodarone  200 mg daily.  She may ultimately be able to discontinue Ecotrin 81 mg and should continue to stay on Eliquis.  She states that she most likely will be returning to [place]  in early May.  I will schedule her a follow-up evaluation to see me at that time.  I discussed with her my plans for retirement at the end of June 2025.  Subsequent to my retirement I will transition her to the care of Dr. Francyne.   Medication Adjustments/Labs and Tests Ordered: Current medicines are reviewed at length with the patient today.  Concerns regarding medicines are outlined above.  Medication changes, Labs and Tests ordered today are listed in the Patient Instructions below. Patient Instructions  Medication Instructions:  No medication changes were made during today's visit  *If you need a refill on your cardiac medications before your next appointment, please call your pharmacy*   Lab Work: No labs were ordered during today's visit.  If you have labs (blood work) drawn today and your tests are completely normal, you will receive your results only by: MyChart Message (if you have MyChart) OR A paper copy in the mail If  you have any lab test that  is abnormal or we need to change your treatment, we will call you to review the results.   Testing/Procedures: No procedures ordered today.    Follow-Up: At [facility], you and your health needs are our priority.  As part of our continuing mission to provide you with exceptional heart care, we have created designated Provider Care Teams.  These Care Teams include your primary Cardiologist (physician) and Advanced Practice Providers (APPs -  Physician Assistants and Nurse Practitioners) who all work together to provide you with the care you need, when you need it.  We recommend signing up for the patient portal called MyChart.  Sign up information is provided on this After Visit Summary.  MyChart is used to connect with patients for Virtual Visits (Telemedicine).  Patients are able to view lab/test results, encounter notes, upcoming appointments, etc.  Non-urgent messages can be sent to your provider as well.   To learn more about what you can do with MyChart, go to [email or web address].    Your next appointment:   3 month(s)  Provider:   Dr. Debby Huff    Other Instructions Thank you for choosing [facility]!       Signed, Chelsey Sor, MD  11/23/2023 5:22 PM    [facility] [address], [address], [place], [place]  [address] Phone: [phone]

## 2023-11-23 ENCOUNTER — Encounter: Payer: Self-pay | Admitting: Cardiovascular Disease

## 2023-12-26 DIAGNOSIS — H35351 Cystoid macular degeneration, right eye: Secondary | ICD-10-CM | POA: Diagnosis not present

## 2023-12-26 DIAGNOSIS — H43813 Vitreous degeneration, bilateral: Secondary | ICD-10-CM | POA: Diagnosis not present

## 2023-12-26 DIAGNOSIS — H35371 Puckering of macula, right eye: Secondary | ICD-10-CM | POA: Diagnosis not present

## 2023-12-26 DIAGNOSIS — H34831 Tributary (branch) retinal vein occlusion, right eye, with macular edema: Secondary | ICD-10-CM | POA: Diagnosis not present

## 2023-12-26 DIAGNOSIS — H353131 Nonexudative age-related macular degeneration, bilateral, early dry stage: Secondary | ICD-10-CM | POA: Diagnosis not present

## 2024-01-23 DIAGNOSIS — H34831 Tributary (branch) retinal vein occlusion, right eye, with macular edema: Secondary | ICD-10-CM | POA: Diagnosis not present

## 2024-01-23 DIAGNOSIS — H35371 Puckering of macula, right eye: Secondary | ICD-10-CM | POA: Diagnosis not present

## 2024-01-23 DIAGNOSIS — H353131 Nonexudative age-related macular degeneration, bilateral, early dry stage: Secondary | ICD-10-CM | POA: Diagnosis not present

## 2024-02-21 ENCOUNTER — Ambulatory Visit: Payer: Medicare HMO | Admitting: Cardiovascular Disease

## 2024-02-29 DIAGNOSIS — H34831 Tributary (branch) retinal vein occlusion, right eye, with macular edema: Secondary | ICD-10-CM | POA: Diagnosis not present

## 2024-02-29 DIAGNOSIS — H43812 Vitreous degeneration, left eye: Secondary | ICD-10-CM | POA: Diagnosis not present

## 2024-02-29 DIAGNOSIS — H35362 Drusen (degenerative) of macula, left eye: Secondary | ICD-10-CM | POA: Diagnosis not present

## 2024-02-29 DIAGNOSIS — H348322 Tributary (branch) retinal vein occlusion, left eye, stable: Secondary | ICD-10-CM | POA: Diagnosis not present

## 2024-03-18 ENCOUNTER — Encounter: Payer: Self-pay | Admitting: Cardiovascular Disease

## 2024-03-18 ENCOUNTER — Ambulatory Visit: Attending: Cardiovascular Disease | Admitting: Cardiovascular Disease

## 2024-03-18 VITALS — BP 148/96 | HR 42 | Ht 65.0 in | Wt 165.6 lb

## 2024-03-18 DIAGNOSIS — M25472 Effusion, left ankle: Secondary | ICD-10-CM

## 2024-03-18 DIAGNOSIS — I4819 Other persistent atrial fibrillation: Secondary | ICD-10-CM

## 2024-03-18 DIAGNOSIS — I10 Essential (primary) hypertension: Secondary | ICD-10-CM

## 2024-03-18 DIAGNOSIS — R001 Bradycardia, unspecified: Secondary | ICD-10-CM

## 2024-03-18 DIAGNOSIS — K219 Gastro-esophageal reflux disease without esophagitis: Secondary | ICD-10-CM

## 2024-03-18 DIAGNOSIS — D6869 Other thrombophilia: Secondary | ICD-10-CM

## 2024-03-18 NOTE — Patient Instructions (Signed)
 Medication Instructions:  Hold Amiodarone  for the next 3 days, then take half a tablet (100 mg) once a day *If you need a refill on your cardiac medications before your next appointment, please call your pharmacy*  Lab Work: Today we are going to draw a Cmet, mag level, CBC, and TSH If you have labs (blood work) drawn today and your tests are completely normal, you will receive your results only by: MyChart Message (if you have MyChart) OR A paper copy in the mail If you have any lab test that is abnormal or we need to change your treatment, we will call you to review the results.  Testing/Procedures: No testing  Follow-Up: At [facility], you and your health needs are our priority.  As part of our continuing mission to provide you with exceptional heart care, our providers are all part of one team.  This team includes your primary Cardiologist (physician) and Advanced Practice Providers or APPs (Physician Assistants and Nurse Practitioners) who all work together to provide you with the care you need, when you need it.  Your next appointment:   Dependent of Afib Clinic  We recommend signing up for the patient portal called "MyChart".  Sign up information is provided on this After Visit Summary.  MyChart is used to connect with patients for Virtual Visits (Telemedicine).  Patients are able to view lab/test results, encounter notes, upcoming appointments, etc.  Non-urgent messages can be sent to your provider as well.   To learn more about what you can do with MyChart, go to [email or web address].   Other Instructions You have been scheduled for 6/12 9 am

## 2024-03-18 NOTE — Progress Notes (Unsigned)
 Cardiology Office Note    Date:  03/20/2024   ID:  Chelsey Huff, Chelsey Huff 1941-03-29, MRN [id number]  PCP:  Jeannine Milroy., MD  Cardiologist:  Magnus Schuller, MD   4 month F/u cardiology evaluation initially referred through the courtesy of Dr. Gurney Lefort at [facility].   History of Present Illness:  Chelsey Huff is a 83 y.o. female who is followed by Dr. Jeana Michaels at [facility].  She was recently diagnosed with TIA and atrial fibrillation in [place]  on September 14, 2083.  Upon her return to [place] , she was evaluated by Dr. Jeana Michaels and presented for cardiology evaluation.  Chelsey Huff, a history of fibroids, osteoporosis, as well as hypertension.  On July 07, 2023 she presented to [facility] emergency room for evaluation of leaning to her right side.  She was packing anticipation of going to [place]  for the winter.  She was able to walk, denied any difficulties with vision or speech and was aware of any rhythm abnormalities.  Her symptoms lasted for 2 hours and then resolved.  She was seen in the emergency room at [facility]; blood pressure initially was elevated at 157/62,  pulse 70 with regular rate and rhythm on PE.  O2 saturation was 100%.  CT of her brain was performed to assess for potential stroke.  There was no intracranial process or evidence for acute or subacute infarct.  CT angio of her head and neck did not show any acute or focal lesions to explain her symptoms.  She was noted to have 50% stenosis of the proximal left internal carotid artery relative to the more distal vessel with dense calcifications.  Atherosclerotic changes were present in the right carotid bifurcation without significant stenoses.  Minimal calcifications are present within the cavernous internal carotid arteries bilaterally without significant stenoses.  She had normal variant CTA circle of Willis without  stenoses.  CT of her head did not show any acute findings.  She was found to have mild chronic ischemic microvascular disease.  Although it was reported that EKG was not done, an EKG apparently was done at 13:48:35 and showed atrial fibrillation at 80 bpm with PVC.   Chelsey Huff had recently purchased a home in [place], [place]  and traveled  to [place]  for vacation prior to Christmas.  On September 14, 2023, while she was reading, her Huff noticed that her speech became slurred and she had difficulty standing up on her own and difficulty walking.  She ultimately presented to [facility] in [facility], [place] .  In the emergency room initial vital signs were notable for blood pressure elevation at 176/102, O2 saturation was 100%, respiratory rate 18, heart rate 81.  I extensively reviewed her evaluation.  Troponins were mildly +8.8.  She was felt to have new onset atrial fibrillation and she was started on heparin.  Cardiology was consulted, I do not have their assessment and an echo Doppler with bubble study was ordered.  Chest x-ray was normal.  She underwent extensive imaging.  Head CT was negative head CTA.  CTA of her neck showed moderate calcification with plaque in the left carotid bulb and proximal left internal carotid artery in the 50 to 69% range and mild to moderate calcific at the right carotid bulb and proximal right internal carotid artery with less than 50% stenosis.  EEG showed atrial fibrillation with PVC  or aberrant complexes.  Ultimately, the patient was started on Eliquis.  She apparently had an echo the day of discharge which is not in the report.  Upon return from [place]  she was evaluated by Dr. Jeana Michaels at [facility] on 12/12/ 2024.  Apparently she had undergone repeat laboratory and was told to add Zetia to current dose of rosuvastatin.  I saw her for my initial cardiology consultation on October 02, 2023.  At that time, her blood pressure was  stable at 122/78.  Rhythm was irregularly irregular with controlled atrial fibrillation rate in the 70s.  ECG confirmed atrial fibrillation with ventricular rate at 84 with premature ventricular aberrantly conducted complexes.  She was on Eliquis 5 mg twice a day and baby aspirin.  With the exact duration of her atrial fibrillation unknown, was concerned that she may have been in atrial fibrillation for several months.  I recommended initiation of amiodarone  200 mg Huff for the first week then recommended increasing this to 200 mg twice a day and attempt to induce potential pharmacologic cardioversion.  I scheduled her for follow-up echo Doppler study.  I also recommended follow-up laboratory after the new year.  On October 18, 2022, laboratory showed total cholesterol 151, HDL 85, LDL 52 and triglycerides 75 on her regimen now consisting of Zetia 10 mg and rosuvastatin 20 mg.  TSH was normal.  LFTs were normal.  LDL cholesterol was now 52 with total cholesterol 151 triglycerides 75 and HDL 85.  On October 22, 2023 an echo Doppler study showed EF at 60 to 65%.  There was felt to be severe LA dilation with moderate RA dilation.  There was mild to moderate mitral regurgitation.  There was mild calcification of a trileaflet aortic valve suggesting mild sclerosis without stenosis.  There was trivial AR.  I last saw her on October 24, 2023 at which time she felt well remained in atrial fibrillation with controlled rate in the 60s.    She denies chest pain or shortness of breath.  She continues to be on amiodarone  200 mg twice a day, aspirin 81 mg Eliquis 5 mg twice a day.  She denies bleeding.  She is tolerating rosuvastatin/Zetia.  During that evaluation I recommended she be seen in the atrial fibrillation clinic and scheduled for outpatient DC cardioversion.  She was evaluated by Minnie Amber, PA-C in the A-fib clinic on November 05, 2023.  She was scheduled to undergo cardioversion and this was successfully done  on November 12, 2023 by Dr. Chyrl Crawford Tobb with restoration of sinus rhythm following 1 shock at 200 J of biphasic synchronized energy.  I saw her in follow-up on November 22, 2023.  Chelsey. Argueta has continued to feel well.  He states her blood pressures at home have been in the 1 24-1 32 range with diastolics in the mid 70s.  He is unaware of any recurrent atrial fibrillation.  She and her Huff plan to go back to [place]  next week if she remains stable.  She continues to be now on amiodarone  at just 200 mg Huff in addition to aspirin 81 and Eliquis 5 mg twice a day, and is on Zetia 10 mg and rosuvastatin 20 mg Huff for Huff.  With her cardioversion she admits to having more energy and is less tired.  She has been in contact with her physician from [place]  who will see her sometime in March for follow-up evaluation.  Chelsey. Salemi and her Huff recently returned from [place]  3 weeks  ago.  She tells me that while in [place]  she did have a follow-up EKG which reportedly was normal.  She is unaware of any recurrence of atrial fibrillation.  She has continued to be on amiodarone  200 mg Huff, aspirin 81 mg, Eliquis 5 mg twice a day, and is on Zetia 10 mg and rosuvastatin 20 mg for lipid management.  She takes Lexapro 10 mg Huff and omeprazole 20 mg for GERD.  She does admit to chronic ankle swelling predominantly of her left ankle and at times wears compression stockings.  She denies any dizziness but does admit to fatigue.  She denies chest pain.  She presents for evaluation.   Past Medical History:  Diagnosis Date   Atrophic vaginitis    Fibroids    Finger fracture 06/22/2018   Hypertension    Osteoporosis     Past Surgical History:  Procedure Laterality Date   ABDOMINAL HYSTERECTOMY     CARDIOVERSION N/A 11/12/2023   Procedure: CARDIOVERSION;  Surgeon: Jerryl Morin, DO;  Location: [facility] INVASIVE CV LAB;  Service: Cardiovascular;  Laterality: N/A;   COLONOSCOPY WITH PROPOFOL  N/A 10/26/2015    Procedure: COLONOSCOPY WITH PROPOFOL ;  Surgeon: Garrett Kallman, MD;  Location: [facility] ENDOSCOPY;  Service: Endoscopy;  Laterality: N/A;   dental implant     TOTAL ABDOMINAL HYSTERECTOMY W/ BILATERAL SALPINGOOPHORECTOMY  1999    Current Medications: Outpatient Medications Prior to Visit  Medication Sig Dispense Refill   Aflibercept  (EYLEA ) 2 MG/0.05ML SOLN 0.05 mL Intravitreal every 6 weeks per Dr. Elnita Hai     amiodarone  (PACERONE ) 200 MG tablet Take 1 tablet (200 mg total) by mouth Huff. 90 tablet 1   aspirin EC 81 MG tablet Take 81 mg by mouth Huff.     calcium-vitamin D  (OSCAL WITH D) 500-200 MG-UNIT tablet Take 1 tablet by mouth 3 (three) times Huff.     denosumab  (PROLIA ) 60 MG/ML SOSY injection Inject 60 mg into the skin every 6 (six) months.     ELIQUIS 5 MG TABS tablet Take 5 mg by mouth 2 (two) times Huff.     escitalopram (LEXAPRO) 10 MG tablet Take 10 mg by mouth Huff.     ezetimibe (ZETIA) 10 MG tablet Take 10 mg by mouth Huff.     multivitamin (THERAGRAN) per tablet Take 1 tablet by mouth Huff.       omeprazole (PRILOSEC) 20 MG capsule Take 20 mg by mouth Huff as needed (acid reflux).     rosuvastatin (CRESTOR) 20 MG tablet Take 20 mg by mouth Huff.     Facility-Administered Medications Prior to Visit  Medication Dose Route Frequency Provider Last Rate Last Admin   denosumab  (PROLIA ) injection 60 mg  60 mg Subcutaneous Once Gottsegen, Amiel Balder, MD         Allergies:   Codeine and Other   Social History   Socioeconomic History   Marital status: Married    Spouse name: Not on file   Number of children: Not on file   Years of education: Not on file   Highest education level: Not on file  Occupational History   Not on file  Tobacco Use   Smoking status: Never   Smokeless tobacco: Never  Vaping Use   Vaping status: Never Used  Substance and Sexual Activity   Alcohol  use: Yes    Alcohol /week: 3.0 standard drinks of alcohol     Types: 3 Glasses of wine per  week   Drug use: No   Sexual activity: Yes  Birth control/protection: Post-menopausal, Surgical  Other Topics Concern   Not on file  Social History Narrative   Not on file   Social Drivers of Health   Financial Resource Strain: Not on file  Food Insecurity: Not on file  Transportation Needs: Not on file  Physical Activity: Not on file  Stress: Not on file  Social Connections: Not on file    Socially, she is married for 55 years.  Her Huff is my patient.  She has 3 children and 9 grandchildren.  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  She drinks wine minimally but has been told to hold off alcohol  presently on Eliquis.  She typically walks on a Huff basis around 5000-7000 steps per day.  Family History:  The patient's family history includes Colon cancer in her sister; Melanoma in her son.  Her mother died at age 6.  Father died in a car accident at age 35.  A brother died at age 44 in a car accident.  Her sister died at age 38 with colon cancer.  Her 54, 50 and 48.  ROS General: Negative; No fevers, chills, or night sweats;  HEENT: Negative; No changes in vision or hearing, sinus congestion, difficulty swallowing Pulmonary: Negative; No cough, wheezing, shortness of breath, hemoptysis Cardiovascular: See HPI GI: Negative; No nausea, vomiting, diarrhea, or abdominal pain GU: Negative; No dysuria, hematuria, or difficulty voiding Musculoskeletal: Negative; no myalgias, joint pain, or weakness Hematologic/Oncology: Negative; no easy bruising, bleeding Endocrine: Negative; no heat/cold intolerance; no diabetes Neuro: Recent transient slurred speech, TIA Skin: Negative; No rashes or skin lesions Psychiatric: Negative; No behavioral problems, depression Sleep: Negative; No snoring, daytime sleepiness, hypersomnolence, bruxism, restless legs, hypnogognic hallucinations, no cataplexy Other comprehensive 14 point system review is negative.   PHYSICAL EXAM:    VS:  BP (!) 148/96   Pulse (!) 42   Ht 5\' 5"  (1.651 m)   Wt 165 lb 9.6 oz (75.1 kg)   SpO2 94%   BMI 27.56 kg/m     Repeat blood pressure by me was 132/76  Wt Readings from Last 3 Encounters:  03/18/24 165 lb 9.6 oz (75.1 kg)  11/22/23 166 lb 6.4 oz (75.5 kg)  11/12/23 160 lb (72.6 kg)    General: Alert, oriented, no distress.  Skin: normal turgor, no rashes, warm and dry HEENT: Normocephalic, atraumatic. Pupils equal round and reactive to light; sclera anicteric; extraocular muscles intact;  Nose without nasal septal hypertrophy Mouth/Parynx benign; Mallinpatti scale 2 Neck: No JVD, no carotid bruits; normal carotid upstroke Lungs: clear to ausculatation and percussion; no wheezing or rales Chest wall: without tenderness to palpitation Heart: PMI not displaced, bradycardic with heart rate in the low 40s; s1 s2 normal, 1/6 systolic murmur, no diastolic murmur, no rubs, gallops, thrills, or heaves Abdomen: soft, nontender; no hepatosplenomehaly, BS+; abdominal aorta nontender and not dilated by palpation. Back: no CVA tenderness Pulses 2+ Musculoskeletal: full range of motion, normal strength, no joint deformities Extremities: no clubbing cyanosis or edema, Homan's sign negative  Neurologic: grossly nonfocal; Cranial nerves grossly wnl Psychologic: Normal mood and affect   Studies/Labs Reviewed:   EKG Interpretation Date/Time:  Tuesday March 18 2024 09:34:14 EDT Ventricular Rate:  42 PR Interval:    QRS Duration:  94 QT Interval:  480 QTC Calculation: 400 R Axis:   -30  Text Interpretation: Junctional bradycardia Left axis deviation Incomplete right bundle branch block Minimal voltage criteria for LVH, may be normal variant ( R in aVL )  When compared with ECG of 22-Nov-2023 09:38, Junctional rhythm has replaced Sinus rhythm Confirmed by Magnus Schuller ([id number]) on 03/18/2024 10:24:44 AM    November 22, 2023 ECG (independently read by me): Sinus bradycardia at 57, first-degree  AV block.  October 02, 2023 ECG (independently read by me): Atrial fibrillation with premature ventricular or aberrantly conducted complexes, ventricular rate 84 bpm.  QTc 4 4 6  Chelsey.  Recent Labs:    Latest Ref Rng & Units 03/18/2024   11:48 AM 10/19/2023    9:35 AM 09/15/2023   12:00 AM  BMP  Glucose 70 - 99 mg/dL 99  604    BUN 8 - 27 mg/dL 27  25    Creatinine 5.40 - 1.00 mg/dL 9.81  1.91    BUN/Creat Ratio 12 - 28 25  23     Sodium 134 - 144 mmol/L 139  139    Potassium 3.5 - 5.2 mmol/L 4.5  4.8    Chloride 96 - 106 mmol/L 101  101    CO2 20 - 29 mmol/L 23  25    Calcium 8.7 - 10.3 mg/dL 47.8  29.5  9.5         This result is from an external source.        Latest Ref Rng & Units 03/18/2024   11:48 AM 10/19/2023    9:35 AM 07/07/2023    2:09 PM  Hepatic Function  Total Protein 6.0 - 8.5 g/dL 7.1  6.3  6.8   Albumin 3.8 - 4.8 g/dL 4.7  4.4  4.1   AST 0 - 40 IU/L 24  20  21    ALT 0 - 32 IU/L 36  25  30   Alk Phosphatase 44 - 121 IU/L 48  29  28   Total Bilirubin 0.0 - 1.2 mg/dL 0.9  0.6  1.1        Latest Ref Rng & Units 03/18/2024   11:48 AM 11/05/2023   11:38 AM 07/07/2023    2:19 PM  CBC  WBC 3.4 - 10.8 x10E3/uL 6.7  4.0    Hemoglobin 11.1 - 15.9 g/dL 62.1  30.8  65.7   Hematocrit 34.0 - 46.6 % 41.2  42.0  42.0   Platelets 150 - 450 x10E3/uL 200  179     Lab Results  Component Value Date   MCV 102 (H) 03/18/2024   MCV 101.4 (H) 11/05/2023   MCV 104.5 (H) 07/07/2023   Lab Results  Component Value Date   TSH 2.000 03/18/2024   No results found for: "HGBA1C"   BNP No results found for: "BNP"  ProBNP No results found for: "PROBNP"   Lipid Panel     Component Value Date/Time   CHOL 151 10/19/2023 0935   TRIG 75 10/19/2023 0935   HDL 85 10/19/2023 0935   CHOLHDL 1.8 10/19/2023 0935   LDLCALC 52 10/19/2023 0935   LABVLDL 14 10/19/2023 0935     RADIOLOGY: No results found.    Additional studies/ records that were reviewed today include:  I  extensively reviewed the records from her [facility] ER evaluation of July 07, 2023, records from [facility] in [place]  from November 29 through September 15, 2023, and records of Dr. Bernetta Brilliant at [facility].  ASSESSMENT:    1. History of persistent atrial fibrillation (HCC)   2. Primary hypertension   3. Junctional bradycardia   4. Hypercoagulable state, secondary (HCC)   5. Left ankle swelling   6. Gastroesophageal  reflux disease without esophagitis    PLAN:  Valentina Alcoser is a very pleasant 83 year old female who has been followed by Dr. Bernetta Brilliant with a history of hypertension, as well as Huff.  She has been on rosuvastatin 20 mg Huff.  She apparently developed a transient episode of leaning on her right side leading to ER evaluation July 07, 2023.  On presentation, her symptoms had essentially resolved and she did not have any weakness in her arms or trouble with vision or speech.  Upon presentation blood pressure was rated at 157/62.  Physical exam says rhythm was regular with normal rate at 70 bpm.   ECG done revealed atrial fibrillation with PVC or aberrantly conducted complexes at a rate of 80.  While in [place]  at their vacation house, she developed new onset slurred speech slightly altered mental status difficulty standing up on her own leading to hospitalization at [facility] in [facility] [place] .  During that evaluation she was found to be in atrial fibrillation, initially started on heparin and then subsequently treated with Eliquis.   She had normal CTA of her head.  Carotid and chest imaging was performed which demonstrated moderate calcific plaque in her carotids bilaterally with a greater stenosis in the range of 50 to 69%.  Troponin was mildly elevated.  She did not have any chest pain or shortness of breath.  Apparently an echo with bubble study was performed.  Back in [place], at her evaluation with Dr. Bernetta Brilliant, she was in atrial  fibrillation.  Zetia 10 mg was added to her prior dose of rosuvastatin 20 mg.  When I saw her, she felt well and denied any chest pain shortness of breath awareness of palpitations, presyncope or syncope.  Her blood pressure is stable she is on Eliquis 5 mg twice a day for anticoagulation and was told also to continue to take baby aspirin 81 mg.  She has been anticoagulated since September 14, 2023.  Her exact duration of atrial fibrillation is unknown, but her ECG from July 07, 2023 revealed atrial fibrillation for which she was completely unaware although on PE she was felt to have normal regular rhythm.  It is possible that she may have been in atrial fibrillation for several months.  She my initial evaluation she was started on amiodarone  200 mg for the first week and subsequently titrated to 200 mg twice a day.  She underwent successful DC cardioversion on November 12, 2023 and when seen by me at follow-up on November 22, 2023 was in sinus rhythm, bradycardic at 57 with first-degree AV block.  She returned to [place]  in came back to the [place] area 3 weeks ago.  She is unaware of any recurrence of atrial fibrillation.  Her EKG today however shows possible junctional bradycardia with low heart rate at 42 versus very slow AF.  No P waves are demonstrated.  I have recommended she hold amiodarone  for the next 3 days and then resume at a reduced dose of 100 mg Huff.  I will check laboratory today with a comprehensive metabolic panel, magnesium, CBC and TSH.  I have recommended she be reevaluated in the atrial fibrillation clinic next week.  She does have chronic left ankle swelling and I recommended she wear compression stockings.  I will contact her regarding the results of her laboratory.  She is anticoagulated and continues to be on low-dose aspirin.  She continues to be on Zetia and rosuvastatin 20 mg for Huff and takes omeprazole for GERD.  She is aware of my upcoming upcoming imminent  retirement.  I will transition both she and and her Huff to the care of Dr. Alvis Ba.    Medication Adjustments/Labs and Tests Ordered: Current medicines are reviewed at length with the patient today.  Concerns regarding medicines are outlined above.  Medication changes, Labs and Tests ordered today are listed in the Patient Instructions below. Patient Instructions  Medication Instructions:  Hold Amiodarone  for the next 3 days, then take half a tablet (100 mg) once a day *If you need a refill on your cardiac medications before your next appointment, please call your pharmacy*  Lab Work: Today we are going to draw a Cmet, mag level, CBC, and TSH If you have labs (blood work) drawn today and your tests are completely normal, you will receive your results only by: MyChart Message (if you have MyChart) OR A paper copy in the mail If you have any lab test that is abnormal or we need to change your treatment, we will call you to review the results.  Testing/Procedures: No testing  Follow-Up: At [facility], you and your health needs are our priority.  As part of our continuing mission to provide you with exceptional heart care, our providers are all part of one team.  This team includes your primary Cardiologist (physician) and Advanced Practice Providers or APPs (Physician Assistants and Nurse Practitioners) who all work together to provide you with the care you need, when you need it.  Your next appointment:   Dependent of Afib Clinic  We recommend signing up for the patient portal called "MyChart".  Sign up information is provided on this After Visit Summary.  MyChart is used to connect with patients for Virtual Visits (Telemedicine).  Patients are able to view lab/test results, encounter notes, upcoming appointments, etc.  Non-urgent messages can be sent to your provider as well.   To learn more about what you can do with MyChart, go to [email or web address].   Other  Instructions You have been scheduled for 6/12 9 am    Signed, Magnus Schuller, MD  03/20/2024 1:27 PM    [facility] [address], [place], [place]  [address] Phone: [phone]

## 2024-03-19 ENCOUNTER — Ambulatory Visit: Payer: Self-pay | Admitting: *Deleted

## 2024-03-19 LAB — CBC
Hematocrit: 41.2 % (ref 34.0–46.6)
Hemoglobin: 13.3 g/dL (ref 11.1–15.9)
MCH: 32.8 pg (ref 26.6–33.0)
MCHC: 32.3 g/dL (ref 31.5–35.7)
MCV: 102 fL — ABNORMAL HIGH (ref 79–97)
Platelets: 200 10*3/uL (ref 150–450)
RBC: 4.05 x10E6/uL (ref 3.77–5.28)
RDW: 12 % (ref 11.7–15.4)
WBC: 6.7 10*3/uL (ref 3.4–10.8)

## 2024-03-19 LAB — COMPREHENSIVE METABOLIC PANEL WITH GFR
ALT: 36 IU/L — ABNORMAL HIGH (ref 0–32)
AST: 24 IU/L (ref 0–40)
Albumin: 4.7 g/dL (ref 3.8–4.8)
Alkaline Phosphatase: 48 IU/L (ref 44–121)
BUN/Creatinine Ratio: 25 (ref 12–28)
BUN: 27 mg/dL (ref 8–27)
Bilirubin Total: 0.9 mg/dL (ref 0.0–1.2)
CO2: 23 mmol/L (ref 20–29)
Calcium: 10.9 mg/dL — ABNORMAL HIGH (ref 8.7–10.3)
Chloride: 101 mmol/L (ref 96–106)
Creatinine, Ser: 1.06 mg/dL — ABNORMAL HIGH (ref 0.57–1.00)
Globulin, Total: 2.4 g/dL (ref 1.5–4.5)
Glucose: 99 mg/dL (ref 70–99)
Potassium: 4.5 mmol/L (ref 3.5–5.2)
Sodium: 139 mmol/L (ref 134–144)
Total Protein: 7.1 g/dL (ref 6.0–8.5)
eGFR: 53 mL/min/{1.73_m2} — ABNORMAL LOW (ref >59)

## 2024-03-19 LAB — TSH: TSH: 2 u[IU]/mL (ref 0.450–4.500)

## 2024-03-19 LAB — MAGNESIUM: Magnesium: 2.3 mg/dL (ref 1.6–2.3)

## 2024-03-20 ENCOUNTER — Encounter: Payer: Self-pay | Admitting: Cardiovascular Disease

## 2024-03-25 ENCOUNTER — Telehealth: Payer: Self-pay

## 2024-03-25 ENCOUNTER — Other Ambulatory Visit: Payer: Self-pay

## 2024-03-25 DIAGNOSIS — Z79899 Other long term (current) drug therapy: Secondary | ICD-10-CM | POA: Diagnosis not present

## 2024-03-25 NOTE — Telephone Encounter (Signed)
 Lab calling to get lab orders.

## 2024-03-26 LAB — PTH, INTACT AND CALCIUM
Calcium: 10.5 mg/dL — ABNORMAL HIGH (ref 8.7–10.3)
PTH: 51 pg/mL (ref 15–65)

## 2024-03-27 ENCOUNTER — Ambulatory Visit (HOSPITAL_COMMUNITY)
Admission: RE | Admit: 2024-03-27 | Discharge: 2024-03-27 | Disposition: A | Discharge status: Home / Self Care | Source: Ambulatory Visit | Attending: Internal Medicine | Admitting: Internal Medicine

## 2024-03-27 VITALS — BP 220/84 | HR 39 | Ht 65.0 in | Wt 165.4 lb

## 2024-03-27 DIAGNOSIS — D6869 Other thrombophilia: Secondary | ICD-10-CM | POA: Diagnosis not present

## 2024-03-27 DIAGNOSIS — I4819 Other persistent atrial fibrillation: Secondary | ICD-10-CM | POA: Diagnosis not present

## 2024-03-27 DIAGNOSIS — I10 Essential (primary) hypertension: Secondary | ICD-10-CM | POA: Diagnosis not present

## 2024-03-27 DIAGNOSIS — I4891 Unspecified atrial fibrillation: Secondary | ICD-10-CM | POA: Diagnosis not present

## 2024-03-27 MED ORDER — AMLODIPINE BESYLATE 2.5 MG PO TABS: 2.5000 mg | ORAL_TABLET | Freq: Every day | ORAL | 4 refills | Status: AC

## 2024-03-27 NOTE — Patient Instructions (Addendum)
 Stop amiodarone    Start Amlodipine 2.5mg  once a day

## 2024-03-27 NOTE — Progress Notes (Signed)
 Primary Care Physician: Jeannine Milroy., MD Primary Cardiologist: Dr. Loetta Ringer Electrophysiologist: None     Referring Physician: Dr. Arlena Lacrosse Chelsey Huff is a 83 y.o. female with a history of TIA, HTN, carotid artery stenosis, HLD and paroxysmal atrial fibrillation who presents for consultation in the [facility]. Seen by Dr. Loetta Ringer on 10/02/23 and started on amiodarone  load for rate controlled Afib (concern for her being out of rhythm for months). Seen on 10/24/23 still noted to be in Afib. Patient is on Eliquis 5 mg BID for a CHADS2VASC score of 6.  On evaluation today, she is currently atrial flutter. She does not appear to have cardiac awareness of her arrhythmia. She began amiodarone  200 mg once daily when seen by Dr. Loetta Ringer in December and then one week later increased to amiodarone  200 mg twice daily. She has remained on amiodarone  200 mg twice daily since then. No missed doses of Eliquis.    On follow up 03/27/24, she is currently in sinus bradycardia. Seen by Dr. Loetta Ringer on 03/18/24 and noted to be in junctional bradycardia. Amiodarone  was held for 3 days and resumed at 100 mg daily. Cmet and TSH overall stable. Patient notes history of HTN but not recently on any HTN medications; her BP today is markedly elevated.  Today, she denies symptoms of palpitations, chest pain, shortness of breath, orthopnea, PND, lower extremity edema, dizziness, presyncope, syncope, snoring, daytime somnolence, bleeding, or neurologic sequela. The patient is tolerating medications without difficulties and is otherwise without complaint today.    she has a BMI of Body mass index is 27.52 kg/m.[name] Filed Weights   03/27/24 0851  Weight: 75 kg     Current Outpatient Medications  Medication Sig Dispense Refill   Aflibercept  (EYLEA ) 2 MG/0.05ML SOLN 0.05 mL Intravitreal every 6 weeks per Dr. Elnita Hai (Patient taking differently: Place into the right eye every 30 (thirty)  days.)     amLODipine (NORVASC) 2.5 MG tablet Take 1 tablet (2.5 mg total) by mouth daily. 30 tablet 4   aspirin EC 81 MG tablet Take 81 mg by mouth daily.     Cholecalciferol (VITAMIN D3) 125 MCG (5000 UT) TABS Take 1 tablet by mouth every morning.     ELIQUIS 5 MG TABS tablet Take 5 mg by mouth 2 (two) times daily.     escitalopram (LEXAPRO) 10 MG tablet Take 10 mg by mouth daily. (Patient taking differently: Take 10 mg by mouth as needed.)     ezetimibe (ZETIA) 10 MG tablet Take 10 mg by mouth daily.     multivitamin (THERAGRAN) per tablet Take 1 tablet by mouth daily.       omeprazole (PRILOSEC) 20 MG capsule Take 20 mg by mouth daily as needed (acid reflux).     rosuvastatin (CRESTOR) 20 MG tablet Take 20 mg by mouth daily.     denosumab  (PROLIA ) 60 MG/ML SOSY injection Inject 60 mg into the skin every 6 (six) months. (Patient not taking: Reported on 03/27/2024)     Current Facility-Administered Medications  Medication Dose Route Frequency Provider Last Rate Last Admin   denosumab  (PROLIA ) injection 60 mg  60 mg Subcutaneous Once Gottsegen, Daniel L, MD        Atrial Fibrillation Management history:  Previous antiarrhythmic drugs: amiodarone  Previous cardioversions: 11/12/23 Previous ablations: none Anticoagulation history: Eliquis 5 mg BID   ROS- All systems are reviewed and negative except as per the HPI above.  Physical  Exam: BP (!) 220/84   Pulse (!) 39   Ht 5' 5 (1.651 m)   Wt 75 kg   BMI 27.52 kg/m   GEN- The patient is well appearing, alert and oriented x 3 today.   Neck - no JVD or carotid bruit noted Lungs- Clear to ausculation bilaterally, normal work of breathing Heart- Regular bradycardic rate and rhythm, no murmurs, rubs or gallops, PMI not laterally displaced Extremities- no clubbing, cyanosis, or edema Skin - no rash or ecchymosis noted   EKG today demonstrates  Vent. rate 39 BPM PR interval * ms QRS duration 102 ms QT/QTcB 504/405 ms P-R-T axes *  -33 37 Sinus bradycardia Left axis deviation Abnormal ECG When compared with ECG of 18-Mar-2024 09:34, No significant change was found  Echo 10/22/23 demonstrated   1. Left ventricular ejection fraction, by estimation, is 60 to 65%. The  left ventricle has normal function. The left ventricle has no regional  wall motion abnormalities. Left ventricular diastolic function could not  be evaluated.   2. Right ventricular systolic function is normal. The right ventricular  size is normal.   3. Left atrial size was severely dilated.   4. Right atrial size was moderately dilated.   5. The mitral valve is normal in structure. Mild to moderate mitral valve  regurgitation. No evidence of mitral stenosis.   6. The aortic valve is normal in structure. There is mild calcification  of the aortic valve. Aortic valve regurgitation is trivial. Aortic valve  sclerosis/calcification is present, without any evidence of aortic  stenosis.   7. The inferior vena cava is normal in size with greater than 50%  respiratory variability, suggesting right atrial pressure of 3 mmHg.   ASSESSMENT & PLAN CHA2DS2-VASc Score = 6  The patient's score is based upon: CHF History: 0 HTN History: 1 Diabetes History: 0 Stroke History: 2 Vascular Disease History: 0 Age Score: 2 Gender Score: 1       ASSESSMENT AND PLAN: Persistent Atrial Fibrillation (ICD10:  I48.19) / atrial flutter The patient's CHA2DS2-VASc score is 6, indicating a 9.7% annual risk of stroke.    She is currently in sinus bradycardia. We discussed due to ongoing marked bradycardia that amiodarone  should be discontinued at this time. Patient will stop amiodarone  and follow up in 2 weeks to reassess. We discussed the potential for tachybrady syndrome and the possibility of EP consultation to discuss PPM.   High risk medication monitoring (ICD10: Z79.899) Patient requires ongoing monitoring for anti-arrhythmic medication which has the potential to  cause life threatening arrhythmias or AV block. Stop amiodarone  due to marked bradycardia   Secondary Hypercoagulable State (ICD10:  D68.69) The patient is at significant risk for stroke/thromboembolism based upon her CHA2DS2-VASc Score of 6.  Continue Apixaban (Eliquis).  No missed doses.   HTN Patient admits to anxiety prior to today's visit and often having elevated BP at medical visits. Due to the marked elevation noted today, will begin low amlodipine 2.5 mg daily and have patient monitor BP at home.      Follow up 2 weeks.   Chelsey Amber, PA-C  [facility] [address] [place], [place] [address] [phone]

## 2024-03-31 ENCOUNTER — Ambulatory Visit: Payer: Self-pay | Admitting: Cardiovascular Disease

## 2024-04-09 ENCOUNTER — Ambulatory Visit (HOSPITAL_COMMUNITY): Admitting: Internal Medicine

## 2024-04-10 ENCOUNTER — Ambulatory Visit (HOSPITAL_COMMUNITY)
Admission: RE | Admit: 2024-04-10 | Discharge: 2024-04-10 | Disposition: A | Discharge status: Home / Self Care | Source: Ambulatory Visit | Attending: Internal Medicine | Admitting: Internal Medicine

## 2024-04-10 ENCOUNTER — Inpatient Hospital Stay (HOSPITAL_COMMUNITY)
Admission: RE | Admit: 2024-04-10 | Discharge: 2024-04-10 | Disposition: A | Discharge status: Home / Self Care | Source: Ambulatory Visit | Attending: Internal Medicine | Admitting: Internal Medicine

## 2024-04-10 VITALS — BP 190/76 | HR 41 | Ht 65.0 in | Wt 165.2 lb

## 2024-04-10 DIAGNOSIS — I4819 Other persistent atrial fibrillation: Secondary | ICD-10-CM

## 2024-04-10 DIAGNOSIS — R001 Bradycardia, unspecified: Secondary | ICD-10-CM

## 2024-04-10 DIAGNOSIS — I4891 Unspecified atrial fibrillation: Secondary | ICD-10-CM | POA: Diagnosis not present

## 2024-04-10 DIAGNOSIS — D6869 Other thrombophilia: Secondary | ICD-10-CM

## 2024-04-10 NOTE — Patient Instructions (Signed)
 Wear monitor for 5 days take off 04/15/24 and send in mail   EP office will call you to make appointment

## 2024-04-10 NOTE — Progress Notes (Signed)
 Primary Care Physician: Chelsey Huff., MD Primary Cardiologist: Dr. Burnard Electrophysiologist: None     Referring Physician: Dr. Burnard [name] is a 83 y.o. female with a history of TIA, HTN, carotid artery stenosis, HLD and paroxysmal atrial fibrillation who presents for consultation in the [facility]. Seen by Dr. Burnard on 10/02/23 and started on amiodarone  load for rate controlled Afib (concern for her being out of rhythm for months). Seen on 10/24/23 still noted to be in Afib. Patient is on Eliquis 5 mg BID for a CHADS2VASC score of 6.  On evaluation today, she is currently atrial flutter. She does not appear to have cardiac awareness of her arrhythmia. She began amiodarone  200 mg once daily when seen by Dr. Burnard in December and then one week later increased to amiodarone  200 mg twice daily. She has remained on amiodarone  200 mg twice daily since then. No missed doses of Eliquis.    On follow up 03/27/24, she is currently in sinus bradycardia. Seen by Dr. Burnard on 03/18/24 and noted to be in junctional bradycardia. Amiodarone  was held for 3 days and resumed at 100 mg daily. Cmet and TSH overall stable. Patient notes history of HTN but not recently on any HTN medications; her BP today is markedly elevated.  On follow up 04/10/24, she is currently in marked sinus bradycardia. Amiodarone  stopped at last office visit due to persistent marked bradycardia. No missed doses of Eliquis 5 mg BID.   Today, she denies symptoms of palpitations, chest pain, shortness of breath, orthopnea, PND, lower extremity edema, dizziness, presyncope, syncope, snoring, daytime somnolence, bleeding, or neurologic sequela. The patient is tolerating medications without difficulties and is otherwise without complaint today.    she has a BMI of Body mass index is 27.49 kg/m.Chelsey Huff Filed Weights   04/10/24 1317  Weight: 74.9 kg      Current Outpatient Medications   Medication Sig Dispense Refill   Aflibercept  (EYLEA ) 2 MG/0.05ML SOLN 0.05 mL Intravitreal every 6 weeks per Dr. Jarold (Patient taking differently: Every 4 weeks)     amLODipine  (NORVASC ) 2.5 MG tablet Take 1 tablet (2.5 mg total) by mouth daily. 30 tablet 4   aspirin EC 81 MG tablet Take 81 mg by mouth daily.     Cholecalciferol (VITAMIN D3) 125 MCG (5000 UT) TABS Take 1 tablet by mouth every morning.     denosumab  (PROLIA ) 60 MG/ML SOSY injection Inject 60 mg into the skin every 6 (six) months.     ELIQUIS 5 MG TABS tablet Take 5 mg by mouth 2 (two) times daily.     escitalopram (LEXAPRO) 10 MG tablet Take 10 mg by mouth daily. (Patient taking differently: Take 10 mg by mouth as needed.)     multivitamin (THERAGRAN) per tablet Take 1 tablet by mouth daily.       omeprazole (PRILOSEC) 20 MG capsule Take 20 mg by mouth daily as needed (acid reflux).     rosuvastatin (CRESTOR) 20 MG tablet Take 20 mg by mouth daily.     ezetimibe (ZETIA) 10 MG tablet Take 10 mg by mouth daily. (Patient not taking: Reported on 04/10/2024)     Current Facility-Administered Medications  Medication Dose Route Frequency Provider Last Rate Last Admin   denosumab  (PROLIA ) injection 60 mg  60 mg Subcutaneous Once Gottsegen, Daniel L, MD        Atrial Fibrillation Management history:  Previous antiarrhythmic drugs: amiodarone  Previous cardioversions:  11/12/23 Previous ablations: none Anticoagulation history: Eliquis 5 mg BID   ROS- All systems are reviewed and negative except as per the HPI above.  Physical Exam: BP (!) 190/76   Pulse (!) 41   Ht 5' 5 (1.651 m)   Wt 74.9 kg   BMI 27.49 kg/m   GEN- The patient is well appearing, alert and oriented x 3 today.   Neck - no JVD or carotid bruit noted Lungs- Clear to ausculation bilaterally, normal work of breathing Heart- Regular bradycardic rate and rhythm, no murmurs, rubs or gallops, PMI not laterally displaced Extremities- no clubbing, cyanosis, or  edema Skin - no rash or ecchymosis noted   EKG today demonstrates  Vent. rate 41 BPM PR interval * ms QRS duration 96 ms QT/QTcB 494/407 ms P-R-T axes * -34 40 Sinus bradycardia Left axis deviation Cannot rule out Anterior infarct , age undetermined Abnormal ECG When compared with ECG of 27-Mar-2024 09:03, No significant change was found  Echo 10/22/23 demonstrated   1. Left ventricular ejection fraction, by estimation, is 60 to 65%. The  left ventricle has normal function. The left ventricle has no regional  wall motion abnormalities. Left ventricular diastolic function could not  be evaluated.   2. Right ventricular systolic function is normal. The right ventricular  size is normal.   3. Left atrial size was severely dilated.   4. Right atrial size was moderately dilated.   5. The mitral valve is normal in structure. Mild to moderate mitral valve  regurgitation. No evidence of mitral stenosis.   6. The aortic valve is normal in structure. There is mild calcification  of the aortic valve. Aortic valve regurgitation is trivial. Aortic valve  sclerosis/calcification is present, without any evidence of aortic  stenosis.   7. The inferior vena cava is normal in size with greater than 50%  respiratory variability, suggesting right atrial pressure of 3 mmHg.   ASSESSMENT & PLAN CHA2DS2-VASc Score = 6  The patient's score is based upon: CHF History: 0 HTN History: 1 Diabetes History: 0 Stroke History: 2 Vascular Disease History: 0 Age Score: 2 Gender Score: 1       ASSESSMENT AND PLAN: Persistent Atrial Fibrillation (ICD10:  I48.19) / atrial flutter The patient's CHA2DS2-VASc score is 6, indicating a 9.7% annual risk of stroke.    She is currently in marked sinus bradycardia. Since stopping amiodarone , her HR has not improved significantly. Will place Zio for 5 days to assess HR; will refer to EP to determine via monitor whether PPM is warranted. She is not on any form of  rate control which can be stopped at this time.  Secondary Hypercoagulable State (ICD10:  D68.69) The patient is at significant risk for stroke/thromboembolism based upon her CHA2DS2-VASc Score of 6.  Continue Apixaban (Eliquis).  No missed doses.   HTN Elevated in office but patient notes at home readings are systolic 140s. Continue amlodipine  2.5 mg daily.     Will help refer to EP to discuss PPM.    Terra Pac, PA-C  [facility] [address] [place], [place] [address] [phone]

## 2024-04-14 DIAGNOSIS — M810 Age-related osteoporosis without current pathological fracture: Secondary | ICD-10-CM | POA: Diagnosis not present

## 2024-04-15 DIAGNOSIS — R82998 Other abnormal findings in urine: Secondary | ICD-10-CM | POA: Diagnosis not present

## 2024-04-15 DIAGNOSIS — Z1339 Encounter for screening examination for other mental health and behavioral disorders: Secondary | ICD-10-CM | POA: Diagnosis not present

## 2024-04-15 DIAGNOSIS — Z1331 Encounter for screening for depression: Secondary | ICD-10-CM | POA: Diagnosis not present

## 2024-04-15 DIAGNOSIS — Z0000 Encounter for general adult medical examination without abnormal findings: Secondary | ICD-10-CM | POA: Diagnosis not present

## 2024-04-16 ENCOUNTER — Telehealth (HOSPITAL_COMMUNITY): Payer: Self-pay | Admitting: Pharmacy Technician

## 2024-04-16 DIAGNOSIS — R001 Bradycardia, unspecified: Secondary | ICD-10-CM | POA: Diagnosis not present

## 2024-04-16 NOTE — Telephone Encounter (Signed)
 Auth Submission: APPROVED Site of care: Site of care: [facility] Payer: [employer] Medicare Medication & CPT/J Code(s) submitted: Prolia  (Denosumab ) [id number] Diagnosis Code: M81.0 Route of submission (phone, fax, portal):  Phone # Fax # Auth type: Buy/Bill HB Units/visits requested: 60mg  x 2 doses Reference number: [id number] Approval from: 04/16/24 to 04/16/25  Dagoberto Armour, CPhT [facility] [phone]

## 2024-04-25 ENCOUNTER — Other Ambulatory Visit (HOSPITAL_COMMUNITY): Payer: Self-pay | Admitting: *Deleted

## 2024-04-28 ENCOUNTER — Ambulatory Visit (HOSPITAL_COMMUNITY)
Admission: RE | Admit: 2024-04-28 | Discharge: 2024-04-28 | Disposition: A | Discharge status: Home / Self Care | Source: Ambulatory Visit | Attending: Internal Medicine | Admitting: Internal Medicine

## 2024-04-28 DIAGNOSIS — R0683 Snoring: Secondary | ICD-10-CM | POA: Diagnosis not present

## 2024-04-28 DIAGNOSIS — M810 Age-related osteoporosis without current pathological fracture: Secondary | ICD-10-CM | POA: Insufficient documentation

## 2024-04-28 MED ORDER — DENOSUMAB 60 MG/ML ~~LOC~~ SOSY
60.0000 mg | PREFILLED_SYRINGE | Freq: Once | SUBCUTANEOUS | Status: AC
  Administered 2024-04-28: 60 mg via SUBCUTANEOUS

## 2024-04-28 MED ORDER — DENOSUMAB 60 MG/ML ~~LOC~~ SOSY
PREFILLED_SYRINGE | SUBCUTANEOUS | Status: AC
  Filled 2024-04-28: qty 1

## 2024-04-29 ENCOUNTER — Other Ambulatory Visit (HOSPITAL_COMMUNITY): Payer: Self-pay | Admitting: Internal Medicine

## 2024-04-29 DIAGNOSIS — I6523 Occlusion and stenosis of bilateral carotid arteries: Secondary | ICD-10-CM

## 2024-04-30 ENCOUNTER — Ambulatory Visit (HOSPITAL_COMMUNITY)
Admission: RE | Admit: 2024-04-30 | Discharge: 2024-04-30 | Disposition: A | Discharge status: Home / Self Care | Source: Ambulatory Visit | Attending: Vascular Surgery | Admitting: Vascular Surgery

## 2024-04-30 DIAGNOSIS — I6523 Occlusion and stenosis of bilateral carotid arteries: Secondary | ICD-10-CM | POA: Insufficient documentation

## 2024-05-05 ENCOUNTER — Ambulatory Visit (HOSPITAL_COMMUNITY): Payer: Self-pay | Admitting: Physician Assistant

## 2024-05-13 ENCOUNTER — Ambulatory Visit: Attending: Internal Medicine | Admitting: Internal Medicine

## 2024-05-13 ENCOUNTER — Encounter: Payer: Self-pay | Admitting: Internal Medicine

## 2024-05-13 VITALS — BP 172/66 | HR 52 | Ht 65.0 in | Wt 162.9 lb

## 2024-05-13 DIAGNOSIS — I495 Sick sinus syndrome: Secondary | ICD-10-CM

## 2024-05-13 NOTE — Progress Notes (Signed)
 HPI Chelsey Huff is referred by the afib clinic for evaluation of sinus node dysfunction in the setting of PAF. She has a long h/o afib and has been on amiodarone  in the past. She feels better in NSR. She had her amiodarone  stopped due to sinus brady. She has been maintained on eliquis. She wore a cardiac monitor demonstrating sinus node dysfunction with HR's in the low 40's and high 30's. She has had a single episode of frank syncope. She feels much better off of amiodarone . Her ave HR at home in the past few months has been in the high 50's.  Allergies  Allergen Reactions   Codeine Nausea And Vomiting   Other Nausea Only    Chromated cat gut, Sutures     Current Outpatient Medications  Medication Sig Dispense Refill   Aflibercept  (EYLEA ) 2 MG/0.05ML SOLN 0.05 mL Intravitreal every 6 weeks per Dr. Jarold (Patient taking differently: Every 4 weeks)     aspirin EC 81 MG tablet Take 81 mg by mouth daily.     Cholecalciferol (VITAMIN D3) 125 MCG (5000 UT) TABS Take 1 tablet by mouth every morning.     denosumab  (PROLIA ) 60 MG/ML SOSY injection Inject 60 mg into the skin every 6 (six) months.     ELIQUIS 5 MG TABS tablet Take 5 mg by mouth 2 (two) times daily.     escitalopram (LEXAPRO) 10 MG tablet Take 10 mg by mouth daily. (Patient taking differently: Take 10 mg by mouth as needed.)     ezetimibe (ZETIA) 10 MG tablet Take 10 mg by mouth daily.     multivitamin (THERAGRAN) per tablet Take 1 tablet by mouth daily.       olmesartan (BENICAR) 20 MG tablet Take 20 mg by mouth daily.     omeprazole (PRILOSEC) 20 MG capsule Take 20 mg by mouth daily as needed (acid reflux).     rosuvastatin (CRESTOR) 20 MG tablet Take 20 mg by mouth daily.     amLODipine  (NORVASC ) 2.5 MG tablet Take 1 tablet (2.5 mg total) by mouth daily. (Patient not taking: Reported on 05/13/2024) 30 tablet 4   Current Facility-Administered Medications  Medication Dose Route Frequency Provider Last Rate Last Admin    denosumab  (PROLIA ) injection 60 mg  60 mg Subcutaneous Once Gottsegen, Daniel L, MD         Past Medical History:  Diagnosis Date   Atrophic vaginitis    Fibroids    Finger fracture 06/22/2018   Hypertension    Osteoporosis     ROS:   All systems reviewed and negative except as noted in the HPI.   Past Surgical History:  Procedure Laterality Date   ABDOMINAL HYSTERECTOMY     CARDIOVERSION N/A 11/12/2023   Procedure: CARDIOVERSION;  Surgeon: Sheena Pugh, DO;  Location: [facility];  Service: Cardiovascular;  Laterality: N/A;   COLONOSCOPY WITH PROPOFOL  N/A 10/26/2015   Procedure: COLONOSCOPY WITH PROPOFOL ;  Surgeon: Gladis MARLA Louder, MD;  Location: [facility];  Service: Endoscopy;  Laterality: N/A;   dental implant     TOTAL ABDOMINAL HYSTERECTOMY W/ BILATERAL SALPINGOOPHORECTOMY  1999     Family History  Problem Relation Age of Onset   Colon cancer Sister    Melanoma Son      Social History   Socioeconomic History   Marital status: Married    Spouse name: Not on file   Number of children: Not on file   Years of education: Not  on file   Highest education level: Not on file  Occupational History   Not on file  Tobacco Use   Smoking status: Never   Smokeless tobacco: Never  Vaping Use   Vaping status: Never Used  Substance and Sexual Activity   Alcohol  use: Yes    Alcohol /week: 3.0 standard drinks of alcohol     Types: 3 Glasses of wine per week   Drug use: No   Sexual activity: Yes    Birth control/protection: Post-menopausal, Surgical  Other Topics Concern   Not on file  Social History Narrative   Not on file   Social Drivers of Health   Financial Resource Strain: Not on file  Food Insecurity: Not on file  Transportation Needs: Not on file  Physical Activity: Not on file  Stress: Not on file  Social Connections: Not on file  Intimate Partner Violence: Not on file     BP (!) 172/66   Pulse (!) 52   Ht 5' 5 (1.651 m)   Wt 162 lb 14.4  oz (73.9 kg)   SpO2 95%   BMI 27.11 kg/m   Physical Exam:  Well appearing 83 yo woman, NAD HEENT: Unremarkable Neck:  No JVD, no thyromegally Lymphatics:  No adenopathy Back:  No CVA tenderness Lungs:  Clear with no wheezes HEART:  Regular bradyrhythm, no murmurs, no rubs, no clicks Abd:  soft, positive bowel sounds, no organomegally, no rebound, no guarding Ext:  2 plus pulses, no edema, no cyanosis, no clubbing Skin:  No rashes no nodules Neuro:  CN II through XII intact, motor grossly intact   Assess/Plan: Sinus node dysfunction - she is improving off of amiodarone . She does not need a PPM today. She will undergo watchful waiting. PAF - she is s/p PVI and mostly maintaining NSR since her DCCV Chronic anti-coag -she will continue her OAC. No bleeding  HTN - her bp was 109 at home this morning.   Chelsey Becci Batty,MD

## 2024-05-13 NOTE — Patient Instructions (Signed)
 Medication Instructions:  Your physician recommends that you continue on your current medications as directed. Please refer to the Current Medication list given to you today.  *If you need a refill on your cardiac medications before your next appointment, please call your pharmacy*  Lab Work: None ordered.  You may go to any [facility] Location for your lab work:  [facility] - [address] ([facility]) - [address] [phone]  [place] - [address]   [place]  - [address]   [place] - [address] - [address] [employer]  - [address] [place] - [address] ([facility]   If you have labs (blood work) drawn today and your tests are completely normal, you will receive your results only by: [employer] (if you have MyChart)  If you have any lab test that is abnormal or we need to change your treatment, we will call you or send a MyChart message to review the results.  Testing/Procedures: None ordered.  Follow-Up: At [facility], you and your health needs are our priority.  As part of our continuing mission to provide you with exceptional heart care, we have created designated Provider Care Teams.  These Care Teams include your primary Cardiologist (physician) and Advanced Practice Providers (APPs -  Physician Assistants and Nurse Practitioners) who all work together to provide you with the care you need, when you need it.  We recommend signing up for the patient portal called MyChart.  Sign up information is provided on this After Visit Summary.  MyChart is used to connect with patients for Virtual Visits (Telemedicine).  Patients are able to view lab/test results, encounter notes, upcoming appointments, etc.  Non-urgent messages can be sent to your provider as well.   To learn more about what you can do with MyChart, go to  [email or web address].    Your next appointment:   As needed  The format for your next appointment:   In Person  Provider:   Donnice Primus, MD or one of the following Advanced Practice Providers on your designated Care Team:   Charlies Arthur, [place] Ozell Jodie Passey, [place] Leotis Barrack, NP  Note: Remote monitoring is used to monitor your Pacemaker/ ICD from home. This monitoring reduces the number of office visits required to check your device to one time per year. It allows us  to keep an eye on the functioning of your device to ensure it is working properly.

## 2024-05-19 DIAGNOSIS — R600 Localized edema: Secondary | ICD-10-CM | POA: Diagnosis not present

## 2024-05-19 DIAGNOSIS — R001 Bradycardia, unspecified: Secondary | ICD-10-CM | POA: Diagnosis not present

## 2024-05-19 DIAGNOSIS — G4700 Insomnia, unspecified: Secondary | ICD-10-CM | POA: Diagnosis not present

## 2024-05-19 DIAGNOSIS — I480 Paroxysmal atrial fibrillation: Secondary | ICD-10-CM | POA: Diagnosis not present

## 2024-05-19 DIAGNOSIS — E785 Hyperlipidemia, unspecified: Secondary | ICD-10-CM | POA: Diagnosis not present

## 2024-05-19 DIAGNOSIS — D6869 Other thrombophilia: Secondary | ICD-10-CM | POA: Diagnosis not present

## 2024-05-19 DIAGNOSIS — I10 Essential (primary) hypertension: Secondary | ICD-10-CM | POA: Diagnosis not present

## 2024-05-20 DIAGNOSIS — M25561 Pain in right knee: Secondary | ICD-10-CM | POA: Diagnosis not present

## 2024-06-23 DIAGNOSIS — M25562 Pain in left knee: Secondary | ICD-10-CM | POA: Diagnosis not present

## 2024-06-23 DIAGNOSIS — M25561 Pain in right knee: Secondary | ICD-10-CM | POA: Diagnosis not present

## 2024-07-15 DIAGNOSIS — L858 Other specified epidermal thickening: Secondary | ICD-10-CM | POA: Diagnosis not present

## 2024-07-15 DIAGNOSIS — M810 Age-related osteoporosis without current pathological fracture: Secondary | ICD-10-CM | POA: Diagnosis not present

## 2024-07-15 DIAGNOSIS — I4891 Unspecified atrial fibrillation: Secondary | ICD-10-CM | POA: Diagnosis not present

## 2024-07-15 DIAGNOSIS — F32A Depression, unspecified: Secondary | ICD-10-CM | POA: Diagnosis not present

## 2024-07-15 DIAGNOSIS — E782 Mixed hyperlipidemia: Secondary | ICD-10-CM | POA: Diagnosis not present

## 2024-07-15 DIAGNOSIS — S80862A Insect bite (nonvenomous), left lower leg, initial encounter: Secondary | ICD-10-CM | POA: Diagnosis not present

## 2024-07-15 DIAGNOSIS — W57XXXA Bitten or stung by nonvenomous insect and other nonvenomous arthropods, initial encounter: Secondary | ICD-10-CM | POA: Diagnosis not present

## 2024-07-21 DIAGNOSIS — Z7189 Other specified counseling: Secondary | ICD-10-CM | POA: Diagnosis not present

## 2024-07-21 DIAGNOSIS — L578 Other skin changes due to chronic exposure to nonionizing radiation: Secondary | ICD-10-CM | POA: Diagnosis not present

## 2024-07-21 DIAGNOSIS — D492 Neoplasm of unspecified behavior of bone, soft tissue, and skin: Secondary | ICD-10-CM | POA: Diagnosis not present

## 2024-08-06 DIAGNOSIS — C44729 Squamous cell carcinoma of skin of left lower limb, including hip: Secondary | ICD-10-CM | POA: Diagnosis not present

## 2024-08-15 NOTE — Progress Notes (Signed)
 Chelsey Huff                                          MRN: [id number]   08/15/2024   The [facility] Quality Team Specialist reviewed this patient medical record for the purposes of chart review for care gap closure. The following were reviewed: chart review for care gap closure-controlling blood pressure.    [facility] Quality Team

## 2024-08-20 DIAGNOSIS — Z4802 Encounter for removal of sutures: Secondary | ICD-10-CM | POA: Diagnosis not present

## 2024-08-28 DIAGNOSIS — C4492 Squamous cell carcinoma of skin, unspecified: Secondary | ICD-10-CM | POA: Diagnosis not present

## 2024-08-28 DIAGNOSIS — I10 Essential (primary) hypertension: Secondary | ICD-10-CM | POA: Diagnosis not present

## 2024-08-28 DIAGNOSIS — F32A Depression, unspecified: Secondary | ICD-10-CM | POA: Diagnosis not present

## 2024-08-28 DIAGNOSIS — I4891 Unspecified atrial fibrillation: Secondary | ICD-10-CM | POA: Diagnosis not present

## 2024-08-28 DIAGNOSIS — E782 Mixed hyperlipidemia: Secondary | ICD-10-CM | POA: Diagnosis not present

## 2024-09-03 DIAGNOSIS — G450 Vertebro-basilar artery syndrome: Secondary | ICD-10-CM | POA: Diagnosis not present

## 2024-09-03 DIAGNOSIS — H43813 Vitreous degeneration, bilateral: Secondary | ICD-10-CM | POA: Diagnosis not present

## 2024-09-03 DIAGNOSIS — H34831 Tributary (branch) retinal vein occlusion, right eye, with macular edema: Secondary | ICD-10-CM | POA: Diagnosis not present

## 2024-09-03 DIAGNOSIS — H3554 Dystrophies primarily involving the retinal pigment epithelium: Secondary | ICD-10-CM | POA: Diagnosis not present

## 2024-09-03 DIAGNOSIS — H538 Other visual disturbances: Secondary | ICD-10-CM | POA: Diagnosis not present

## 2024-09-19 DIAGNOSIS — R9431 Abnormal electrocardiogram [ECG] [EKG]: Secondary | ICD-10-CM | POA: Diagnosis not present

## 2024-09-19 DIAGNOSIS — I480 Paroxysmal atrial fibrillation: Secondary | ICD-10-CM | POA: Diagnosis not present

## 2024-09-19 DIAGNOSIS — I10 Essential (primary) hypertension: Secondary | ICD-10-CM | POA: Diagnosis not present

## 2024-09-19 DIAGNOSIS — R55 Syncope and collapse: Secondary | ICD-10-CM | POA: Diagnosis not present

## 2024-09-19 DIAGNOSIS — I440 Atrioventricular block, first degree: Secondary | ICD-10-CM | POA: Diagnosis not present

## 2024-11-07 NOTE — Progress Notes (Signed)
 Chelsey Huff                                          MRN: [id number]   11/07/2024   The [facility] Quality Team Specialist reviewed this patient medical record for the purposes of chart review for care gap closure. The following were reviewed: chart review for care gap closure-controlling blood pressure.    [facility]I Quality Team
